# Patient Record
Sex: Female | Born: 1945 | Race: White | Hispanic: No | Marital: Married | Smoking: Never smoker
Health system: Southern US, Community
[De-identification: ages and names within clinical notes are randomized; demographics above are authoritative.]

## PROBLEM LIST (undated history)

## (undated) DIAGNOSIS — K219 Gastro-esophageal reflux disease without esophagitis: Secondary | ICD-10-CM

## (undated) DIAGNOSIS — G4733 Obstructive sleep apnea (adult) (pediatric): Secondary | ICD-10-CM

## (undated) DIAGNOSIS — E7800 Pure hypercholesterolemia, unspecified: Secondary | ICD-10-CM

---

## 1999-12-01 ENCOUNTER — Encounter: Admission: RE | Admit: 1999-12-01 | Discharge: 1999-12-01 | Payer: Self-pay | Admitting: Obstetrics and Gynecology

## 1999-12-01 ENCOUNTER — Encounter: Payer: Self-pay | Admitting: Obstetrics and Gynecology

## 2000-08-17 ENCOUNTER — Encounter: Payer: Self-pay | Admitting: Obstetrics and Gynecology

## 2000-08-17 ENCOUNTER — Encounter: Admission: RE | Admit: 2000-08-17 | Discharge: 2000-08-17 | Payer: Self-pay | Admitting: Obstetrics and Gynecology

## 2000-09-30 ENCOUNTER — Ambulatory Visit (HOSPITAL_COMMUNITY): Admission: RE | Admit: 2000-09-30 | Discharge: 2000-09-30 | Payer: Self-pay | Admitting: Gastroenterology

## 2000-09-30 ENCOUNTER — Encounter: Payer: Self-pay | Admitting: Gastroenterology

## 2000-10-06 ENCOUNTER — Ambulatory Visit (HOSPITAL_COMMUNITY): Admission: RE | Admit: 2000-10-06 | Discharge: 2000-10-06 | Payer: Self-pay | Admitting: Family Medicine

## 2000-10-06 ENCOUNTER — Encounter: Payer: Self-pay | Admitting: Family Medicine

## 2000-10-13 ENCOUNTER — Ambulatory Visit (HOSPITAL_COMMUNITY): Admission: RE | Admit: 2000-10-13 | Discharge: 2000-10-13 | Payer: Self-pay | Admitting: Gastroenterology

## 2000-10-13 ENCOUNTER — Encounter (INDEPENDENT_AMBULATORY_CARE_PROVIDER_SITE_OTHER): Payer: Self-pay | Admitting: Specialist

## 2001-08-23 ENCOUNTER — Encounter: Payer: Self-pay | Admitting: Obstetrics and Gynecology

## 2001-08-23 ENCOUNTER — Encounter: Admission: RE | Admit: 2001-08-23 | Discharge: 2001-08-23 | Payer: Self-pay | Admitting: Obstetrics and Gynecology

## 2002-08-24 ENCOUNTER — Encounter: Payer: Self-pay | Admitting: Obstetrics and Gynecology

## 2002-08-24 ENCOUNTER — Encounter: Admission: RE | Admit: 2002-08-24 | Discharge: 2002-08-24 | Payer: Self-pay | Admitting: Obstetrics and Gynecology

## 2003-03-21 ENCOUNTER — Other Ambulatory Visit: Admission: RE | Admit: 2003-03-21 | Discharge: 2003-03-21 | Payer: Self-pay | Admitting: Obstetrics and Gynecology

## 2003-06-05 ENCOUNTER — Inpatient Hospital Stay (HOSPITAL_COMMUNITY): Admission: EM | Admit: 2003-06-05 | Discharge: 2003-06-06 | Payer: Self-pay | Admitting: Emergency Medicine

## 2003-06-05 ENCOUNTER — Encounter: Payer: Self-pay | Admitting: Emergency Medicine

## 2003-08-29 ENCOUNTER — Encounter: Payer: Self-pay | Admitting: Obstetrics and Gynecology

## 2003-08-29 ENCOUNTER — Encounter: Admission: RE | Admit: 2003-08-29 | Discharge: 2003-08-29 | Payer: Self-pay | Admitting: Obstetrics and Gynecology

## 2004-04-15 ENCOUNTER — Other Ambulatory Visit: Admission: RE | Admit: 2004-04-15 | Discharge: 2004-04-15 | Payer: Self-pay | Admitting: Obstetrics and Gynecology

## 2004-04-22 ENCOUNTER — Ambulatory Visit (HOSPITAL_COMMUNITY): Admission: RE | Admit: 2004-04-22 | Discharge: 2004-04-22 | Payer: Self-pay | Admitting: Gastroenterology

## 2004-10-07 ENCOUNTER — Encounter: Admission: RE | Admit: 2004-10-07 | Discharge: 2004-10-07 | Payer: Self-pay | Admitting: Obstetrics and Gynecology

## 2005-05-04 ENCOUNTER — Other Ambulatory Visit: Admission: RE | Admit: 2005-05-04 | Discharge: 2005-05-04 | Payer: Self-pay | Admitting: Obstetrics and Gynecology

## 2005-10-12 ENCOUNTER — Encounter: Admission: RE | Admit: 2005-10-12 | Discharge: 2005-10-12 | Payer: Self-pay | Admitting: Family Medicine

## 2006-10-28 ENCOUNTER — Encounter: Admission: RE | Admit: 2006-10-28 | Discharge: 2006-10-28 | Payer: Self-pay | Admitting: Family Medicine

## 2007-10-31 ENCOUNTER — Encounter: Admission: RE | Admit: 2007-10-31 | Discharge: 2007-10-31 | Payer: Self-pay | Admitting: Obstetrics and Gynecology

## 2007-11-04 ENCOUNTER — Encounter: Admission: RE | Admit: 2007-11-04 | Discharge: 2007-11-04 | Payer: Self-pay | Admitting: Obstetrics and Gynecology

## 2008-11-13 ENCOUNTER — Encounter: Admission: RE | Admit: 2008-11-13 | Discharge: 2008-11-13 | Payer: Self-pay | Admitting: Obstetrics and Gynecology

## 2008-11-15 ENCOUNTER — Other Ambulatory Visit: Admission: RE | Admit: 2008-11-15 | Discharge: 2008-11-15 | Payer: Self-pay | Admitting: Family Medicine

## 2009-01-06 ENCOUNTER — Emergency Department (HOSPITAL_COMMUNITY): Admission: EM | Admit: 2009-01-06 | Discharge: 2009-01-06 | Payer: Self-pay | Admitting: Emergency Medicine

## 2010-11-25 ENCOUNTER — Encounter: Admission: RE | Admit: 2010-11-25 | Discharge: 2010-11-25 | Payer: Self-pay | Admitting: Obstetrics and Gynecology

## 2011-01-18 ENCOUNTER — Encounter: Payer: Self-pay | Admitting: Obstetrics and Gynecology

## 2011-04-13 LAB — COMPREHENSIVE METABOLIC PANEL
AST: 25 U/L (ref 0–37)
Albumin: 3.1 g/dL — ABNORMAL LOW (ref 3.5–5.2)
Alkaline Phosphatase: 86 U/L (ref 39–117)
BUN: 9 mg/dL (ref 6–23)
GFR calc Af Amer: >60 mL/min (ref >60)
Potassium: 3.8 mEq/L (ref 3.5–5.1)
Total Protein: 7 g/dL (ref 6.0–8.3)

## 2011-04-13 LAB — DIFFERENTIAL
Basophils Relative: 0 % (ref 0–1)
Lymphocytes Relative: 25 % (ref 12–46)
Monocytes Absolute: 0.7 10*3/uL (ref 0.1–1.0)
Monocytes Relative: 9 % (ref 3–12)
Neutro Abs: 4.8 10*3/uL (ref 1.7–7.7)

## 2011-04-13 LAB — CBC
HCT: 42.8 % (ref 36.0–46.0)
Platelets: 295 10*3/uL (ref 150–400)
RDW: 12.2 % (ref 11.5–15.5)

## 2011-04-13 LAB — URINALYSIS, ROUTINE W REFLEX MICROSCOPIC
Glucose, UA: NEGATIVE mg/dL
Ketones, ur: NEGATIVE mg/dL
Nitrite: NEGATIVE
Protein, ur: NEGATIVE mg/dL
Urobilinogen, UA: 0.2 mg/dL (ref 0.0–1.0)

## 2011-05-15 NOTE — Discharge Summary (Signed)
[name], [name]                           ACCOUNT NO.:  [id number]   MEDICAL RECORD NO.:  [id number]                   PATIENT TYPE:  INP   LOCATION:  2003                                 FACILITY:  [facility]   PHYSICIAN:  Francisca December, M.D.               DATE OF BIRTH:  1946/07/17   DATE OF ADMISSION:  06/05/2003  DATE OF DISCHARGE:  06/06/2003                                 DISCHARGE SUMMARY   ADMISSION DIAGNOSES:  1. Atypical chest pain; rule out myocardial infarction.  2. Hiatal hernia.  3. Unknown lipid status.  4. Elevated blood sugar; questionable glucose intolerance.  5. Postmenopausal.   DISCHARGE DIAGNOSES:  1. Atypical chest pain, resolved, myocardial infarction ruled out with     negative enzymes.  Scheduled for outpatient stress test.  2. Lipid profile is pending.  3. Fasting blood sugar this morning was 89.  4. Gout.  5. Glucose intolerance.   HISTORY OF PRESENT ILLNESS:  The patient is a 65 year old married white  female without prior heart disease but does comment on a history of  rheumatic heart disease as a child that she was told she would grow out of  it.  Today, at a class on how to identify myocardial infarction at church,  she developed pressure in chest, lightheadedness and nausea.  She was given  an aspirin and felt clammy.  She put her head down and then felt better  within about ten minutes.  EMS was called and she was brought to [facility].  She was not given any nitroglycerin.  She states she has never  felt this way before and her symptoms are gone now.   In the emergency room, EKG shows normal sinus rhythm without acute  abnormality.  Initial cardiac enzymes are negative.  She will be transferred  for three hour observation to rule out MI.  Will check cardiac enzymes and  this patient is low for ischemia; therefore, heparin and nitroglycerin will  not be started.  Will continue an aspirin a day.  Will check fasting BMET  and  lipid profile in the morning.  If her tests come back negative, she will  be discharged to home.   PROCEDURE:  None.   CONSULTATIONS:  None.   COMPLICATIONS:  None.   HOSPITAL COURSE:  The patient was admitted to [facility] on June 05, 2003, with atypical chest pain.  EKG was negative for ischemia.  Cardiac  enzymes were negative x3 as follows:  CK 180, 123, 94; MB 4.3, 2.6, 1.7;  Troponin-I less than 0.01, 0.01, less than 0.01.   Glucose, upon admission, was elevated at 117.  A fasting BMET, drawn the  morning of June 06, 2003, showed glucose 89, BUN and creatinine were 8 and  0.8, respectively.  Chest x-ray showed no active disease.   The patient was felt to be stable for  discharge to home on June 06, 2003.  She had no recurrent chest discomfort.   DISCHARGE MEDICATIONS:  1. Enteric-coated aspirin 325 mg.  2. Protonix 40 mg daily.   ACTIVITY:  As tolerated.  No strenuous activity until results of tests are  obtained.   DIET:  Low fat, low salt.   FOLLOWUP:  Cardiac stress test is scheduled for Thursday, June 14, 2003, at  8:30 in the morning.  She should anticipate being at the office for at least  four hours.  Nothing to eat or drink after midnight the night before the  study.  No caffeine the day of or before the test.     [name], P.A.-C                 Francisca December, M.D.    TCJ/MEDQ  D:  06/06/2003  T:  06/06/2003  Job:  [id number]   cc:   [name], M.D.  [address]  [place]  [place] [address]  Fax: [phone]

## 2011-05-15 NOTE — H&P (Signed)
[name], [name]                           ACCOUNT NO.:  [id number]   MEDICAL RECORD NO.:  [id number]                   PATIENT TYPE:  INP   LOCATION:  1823                                 FACILITY:  [facility]   PHYSICIAN:  [name], M.D.               DATE OF BIRTH:  [birth year]   DATE OF ADMISSION:  06/05/2003  DATE OF DISCHARGE:                                HISTORY & PHYSICAL   ADMISSION DIAGNOSIS:  Atypical chest pain.   CHIEF COMPLAINT:  10-minute chest discomfort.   HISTORY OF PRESENT ILLNESS:  This 65 year old white female without prior  heart disease, but does make a comment about history of rheumatic heart  disease as a child.  Today at a class on how to identify a myocardial  infarction at church, she suddenly developed pressure in the chest, light  headedness, and nausea.  She was given an aspirin and felt clammy. She put  her head down and then felt better within about 10 minutes.  EMS was called  and she was brought to [facility]. She was not given any  nitroglycerin. She states that she has never felt this way before and her  symptoms are gone now.   ALLERGIES:  CODEINE causes hives.   MEDICATIONS:  None.   PAST MEDICAL HISTORY:  1. Rheumatic heart disease as a child - valve did not open right. Told she     would grow out of it.  2. Denies hyperlipidemia or diabetes.  3. Postmenopausal - took estrogen for three years. Has been off for about     six months.   SOCIAL HISTORY:  The patient is married. Mother of two.  Denies tobacco and  alcohol.  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 She has been walking daily for  two weeks without symptoms.   FAMILY HISTORY:  No first degree relatives with heart disease. She did have  a maternal uncle who died of an MI at age 64.   REVIEW OF SYSTEMS:  No constitutional symptoms, fever, chills, heartburn,  reflux, headache, or visual changes. No dysphagia.  Hearing okay.  No  thyroid disease, cough, or  hemoptysis. She does have a hiatal hernia. This  was diagnosed by an upper GI.  No melena or dark, tarry stools.  No  musculoskeletal complaints. No CVA or neurologic problems.  No memory  difficulty.   PHYSICAL EXAMINATION:  VITAL SIGNS: Blood pressure 114/73, pulse 87,  respirations 23.  GENERAL: The patient is alert and oriented x3 and in no acute distress.  HEENT:  Normocephalic and atraumatic.  PERRL. EOMI.  Nares patent.  Oropharynx benign.  NECK:  Supple without bruits or masses.  LUNGS:  Clear to auscultation. No wheeze or crackles.  HEART:  Regular rate and rhythm without murmurs, rubs, or gallops.  ABDOMEN:  Soft, nontender, and nondistended.  Normal active bowel sounds x4  quadrants.  No hepatosplenomegaly.  EXTREMITIES:  2+ femoral pulses bilaterally.  No bruits.  2+ distal pulses  bilaterally without edema.  NEUROLOGY:  Nonfocal.  Mentation intact.   EKG shows normal sinus rhythm with no acute abnormality.  Chest x-ray  results are pending.   LABORATORY DATA:  WBC 8.5, hemoglobin 12.6, platelets 253.  Sodium 136,  potassium 4.1, BUN 11, creatinine 1.0, and glucose slightly elevated at 117.  CK 180, MB 4.3, and troponin I less than 0.01.  The pH 7.41, pCO2 43.0, hCO3  28.0. UA negative.   IMPRESSION:  1. Atypical chest pain, rule out myocardial infarction.  2. Elevated blood sugar - question glucose intolerance.  3. Unknown lipid status.  4. Postmenopausal.  5. Rheumatic heart disease as a child.  6. Hiatal hernia.   PLAN:  Admit.  Check all cardiac enzymes.  Check fasting BMET in the  morning. Daily aspirin. EKG in the morning.  D5 1/2 normal saline at K[facility].   If all of her tests come back negative, she will go home in the morning and  we will plan for an outpatient stress test.     [name], P.A.                   [name], M.D.    TCJ/MEDQ  D:  06/05/2003  T:  06/05/2003  Job:  [id number]   cc:   [name], M.D.  [address]  [place]  [place] [address]  Fax: [phone]

## 2011-05-15 NOTE — Op Note (Signed)
[name], [name]                           ACCOUNT NO.:  [id number]   MEDICAL RECORD NO.:  [id number]                   PATIENT TYPE:  AMB   LOCATION:  ENDO                                 FACILITY[facility]  [facility]   PHYSICIAN:  Petra Kuba, M.D.                 DATE OF BIRTH:  May 23, 1946   DATE OF PROCEDURE:  04/22/2004  DATE OF DISCHARGE:                                 OPERATIVE REPORT   PROCEDURE:  Colonoscopy.   INDICATIONS FOR PROCEDURE:  History of colon polyps due for repeat  screening.   Consent was signed after risks, benefits, methods, and options were  thoroughly discussed in the office in the past.   MEDICINES USED:  Demerol 50, Versed 5.   DESCRIPTION OF PROCEDURE:  Rectal inspection was pertinent for small  external hemorrhoids. Digital exam was negative. The pediatric video  adjustable colonoscope was inserted and with abdominal pressure  easily able  to be advanced around the colon to the cecum. No abnormality was seen on  insertion.  The cecum was identified by the appendiceal orifice and the  ileocecal valve, the scope was slowly withdrawn. The  prep was adequate.  There was some liquid stool that required washing and suctioning.  On slow  withdrawal through the colon, the cecum, ascending, transverse, and majority  of the descending was normal.  There was a rare very early left sided  diverticula but no polyps were seen.  Once back in the rectum, anal rectal  pullthrough and retroflexion confirmed some small hemorrhoids.  The scope  was reinserted a short ways up the left side of the colon, air was  suctioned, scope removed.  The patient tolerated the procedure well. There  was no obvious or immediate complications.   ENDOSCOPIC DIAGNOSIS:  1. Internal and external small hemorrhoids.  2. Rare very early left sided diverticula.  3. Otherwise within normal limits to the cecum.   PLAN:  Happy to see back p.r.n. otherwise return care to Dr. Nicholos Johns for the  customary health care maintenance to include yearly rectals and guaiacs and  recheck colon screening in five years.                                               Petra Kuba, M.D.    MEM/MEDQ  D:  04/22/2004  T:  04/22/2004  Job:  [id number]   cc:   Molly Maduro A. Nicholos Johns, M.D.  [address]  [place]  [place] [address]  Fax: [phone]

## 2011-05-15 NOTE — Procedures (Signed)
[facility]  Patient:    [name], [name]                      MRN: [id number] Proc. Date: 10/13/00 Adm. Date:  [id number] Attending:  Nelda Marseille CC:         Elana Alm. Eliezer Lofts., M.D.   Procedure Report  PROCEDURE:  Colonoscopy with polypectomy.  INDICATIONS FOR PROCEDURE:  Vague GI symptoms seems to be changing periodically. Due for colonic screening.  Consent was signed after risks, benefits, methods, and options were thoroughly discussed in the office and before this procedure.  MEDICINES USED:  Demerol 70, Versed 7.  DESCRIPTION OF PROCEDURE:  Rectal inspection was pertinent for small external hemorrhoids. Digital exam was negative. The video pediatric colonoscope was inserted and fairly easily advanced around the colon to the cecum despite a very tortuous colon. This did require rolling on her back and some abdominal pressures. On insertion in the transverse, 2 sessile small polyps were seen and were each cold biopsied x 1 on insertion. It was identified by the appendiceal orifice and the ileocecal valve. No other obvious abnormalities were seen on insertion. The scope was inserted a short ways into the terminal ileum which was normal. Photo documentation was obtained. The scope was slowly withdrawn. The prep was adequate. There was some liquid stool that required washing and suctioning. In the cecum, a 2-3 mm polyp was seen and was cold biopsied x 3. The scope was further withdrawn. Two ascending and the same 2 transverse polyps seen on insertion, all sessile were seen and were hot biopsied x 1-3 depending on their size. Two of them were linear along the fold and slightly sessile approximately 5-6 mm long. All of these were put in the same container. The scope was further withdrawn. The descending and majority of the sigmoid was normal. In the distal sigmoid, a 5 mm polyp was seen, snared, electrocautery applied and the polyp was  suctioned through the scope and collected in the trap. There was a nice white coagulum without any residual polyp seen. The scope was further withdrawn and in the proximal rectum, a tiny 2 mm polyp was seen and was hot biopsied x 1 and put in the same path container with the sigmoid polyp. Once back in the rectum, the scope was retroflexed pertinent for some internal hemorrhoids. The scope was straightened, air was suctioned, scope removed. The patient tolerated the procedure well and there was on obvious or immediate complication.  ENDOSCOPIC DIAGNOSIS: 1. Internal/external hemorrhoids, small. 2. Multiple small polyps with hot biopsies in the rectum, transverse and the    ascending. A snare in the sigmoid and cold biopsy of the cecum. 3. Tortuous colon. 4. Otherwise within normal limits to the terminal ileum.  PLAN:  Await pathology to determine future colonic screening. 10 day post polypectomy instructions. Will give her some hiatal hernia reading materials and continue Prevacid and follow-up in 6-8 weeks but have her call me sooner p.r.n. DD:  10/13/00 TD:  10/14/00 Job: [id number]

## 2011-10-19 ENCOUNTER — Other Ambulatory Visit: Payer: Self-pay | Admitting: Obstetrics and Gynecology

## 2011-10-19 DIAGNOSIS — Z1231 Encounter for screening mammogram for malignant neoplasm of breast: Secondary | ICD-10-CM

## 2011-12-01 ENCOUNTER — Ambulatory Visit
Admission: RE | Admit: 2011-12-01 | Discharge: 2011-12-01 | Disposition: A | Discharge status: Home / Self Care | Payer: Medicare Other | Source: Ambulatory Visit | Attending: Obstetrics and Gynecology | Admitting: Obstetrics and Gynecology

## 2011-12-01 DIAGNOSIS — Z1231 Encounter for screening mammogram for malignant neoplasm of breast: Secondary | ICD-10-CM

## 2011-12-30 DIAGNOSIS — M5137 Other intervertebral disc degeneration, lumbosacral region: Secondary | ICD-10-CM | POA: Diagnosis not present

## 2011-12-30 DIAGNOSIS — IMO0001 Reserved for inherently not codable concepts without codable children: Secondary | ICD-10-CM | POA: Diagnosis not present

## 2011-12-30 DIAGNOSIS — M545 Low back pain, unspecified: Secondary | ICD-10-CM | POA: Diagnosis not present

## 2011-12-30 DIAGNOSIS — M999 Biomechanical lesion, unspecified: Secondary | ICD-10-CM | POA: Diagnosis not present

## 2012-01-01 DIAGNOSIS — IMO0001 Reserved for inherently not codable concepts without codable children: Secondary | ICD-10-CM | POA: Diagnosis not present

## 2012-01-01 DIAGNOSIS — M545 Low back pain, unspecified: Secondary | ICD-10-CM | POA: Diagnosis not present

## 2012-01-01 DIAGNOSIS — M5137 Other intervertebral disc degeneration, lumbosacral region: Secondary | ICD-10-CM | POA: Diagnosis not present

## 2012-01-01 DIAGNOSIS — M999 Biomechanical lesion, unspecified: Secondary | ICD-10-CM | POA: Diagnosis not present

## 2012-01-04 DIAGNOSIS — M999 Biomechanical lesion, unspecified: Secondary | ICD-10-CM | POA: Diagnosis not present

## 2012-01-04 DIAGNOSIS — IMO0001 Reserved for inherently not codable concepts without codable children: Secondary | ICD-10-CM | POA: Diagnosis not present

## 2012-01-04 DIAGNOSIS — M545 Low back pain, unspecified: Secondary | ICD-10-CM | POA: Diagnosis not present

## 2012-01-04 DIAGNOSIS — M5137 Other intervertebral disc degeneration, lumbosacral region: Secondary | ICD-10-CM | POA: Diagnosis not present

## 2012-01-11 DIAGNOSIS — M545 Low back pain, unspecified: Secondary | ICD-10-CM | POA: Diagnosis not present

## 2012-01-11 DIAGNOSIS — M999 Biomechanical lesion, unspecified: Secondary | ICD-10-CM | POA: Diagnosis not present

## 2012-01-11 DIAGNOSIS — M5137 Other intervertebral disc degeneration, lumbosacral region: Secondary | ICD-10-CM | POA: Diagnosis not present

## 2012-01-11 DIAGNOSIS — IMO0001 Reserved for inherently not codable concepts without codable children: Secondary | ICD-10-CM | POA: Diagnosis not present

## 2012-01-14 DIAGNOSIS — M545 Low back pain, unspecified: Secondary | ICD-10-CM | POA: Diagnosis not present

## 2012-01-14 DIAGNOSIS — M5137 Other intervertebral disc degeneration, lumbosacral region: Secondary | ICD-10-CM | POA: Diagnosis not present

## 2012-01-14 DIAGNOSIS — M999 Biomechanical lesion, unspecified: Secondary | ICD-10-CM | POA: Diagnosis not present

## 2012-01-14 DIAGNOSIS — IMO0001 Reserved for inherently not codable concepts without codable children: Secondary | ICD-10-CM | POA: Diagnosis not present

## 2012-01-20 DIAGNOSIS — M999 Biomechanical lesion, unspecified: Secondary | ICD-10-CM | POA: Diagnosis not present

## 2012-01-20 DIAGNOSIS — M545 Low back pain, unspecified: Secondary | ICD-10-CM | POA: Diagnosis not present

## 2012-01-20 DIAGNOSIS — IMO0001 Reserved for inherently not codable concepts without codable children: Secondary | ICD-10-CM | POA: Diagnosis not present

## 2012-01-20 DIAGNOSIS — M5137 Other intervertebral disc degeneration, lumbosacral region: Secondary | ICD-10-CM | POA: Diagnosis not present

## 2012-01-25 DIAGNOSIS — M5137 Other intervertebral disc degeneration, lumbosacral region: Secondary | ICD-10-CM | POA: Diagnosis not present

## 2012-01-25 DIAGNOSIS — M545 Low back pain, unspecified: Secondary | ICD-10-CM | POA: Diagnosis not present

## 2012-01-25 DIAGNOSIS — M999 Biomechanical lesion, unspecified: Secondary | ICD-10-CM | POA: Diagnosis not present

## 2012-01-25 DIAGNOSIS — IMO0001 Reserved for inherently not codable concepts without codable children: Secondary | ICD-10-CM | POA: Diagnosis not present

## 2012-05-10 DIAGNOSIS — R232 Flushing: Secondary | ICD-10-CM | POA: Diagnosis not present

## 2012-05-10 DIAGNOSIS — IMO0002 Reserved for concepts with insufficient information to code with codable children: Secondary | ICD-10-CM | POA: Diagnosis not present

## 2012-05-10 DIAGNOSIS — E785 Hyperlipidemia, unspecified: Secondary | ICD-10-CM | POA: Diagnosis not present

## 2012-05-10 DIAGNOSIS — G479 Sleep disorder, unspecified: Secondary | ICD-10-CM | POA: Diagnosis not present

## 2012-05-10 DIAGNOSIS — M1710 Unilateral primary osteoarthritis, unspecified knee: Secondary | ICD-10-CM | POA: Diagnosis not present

## 2012-10-05 DIAGNOSIS — Z23 Encounter for immunization: Secondary | ICD-10-CM | POA: Diagnosis not present

## 2012-10-21 ENCOUNTER — Other Ambulatory Visit: Payer: Self-pay | Admitting: Obstetrics and Gynecology

## 2012-10-21 DIAGNOSIS — Z1231 Encounter for screening mammogram for malignant neoplasm of breast: Secondary | ICD-10-CM

## 2012-10-31 DIAGNOSIS — H43399 Other vitreous opacities, unspecified eye: Secondary | ICD-10-CM | POA: Diagnosis not present

## 2012-10-31 DIAGNOSIS — H2510 Age-related nuclear cataract, unspecified eye: Secondary | ICD-10-CM | POA: Diagnosis not present

## 2012-12-01 ENCOUNTER — Ambulatory Visit
Admission: RE | Admit: 2012-12-01 | Discharge: 2012-12-01 | Disposition: A | Discharge status: Home / Self Care | Payer: Medicare Other | Source: Ambulatory Visit | Attending: Obstetrics and Gynecology | Admitting: Obstetrics and Gynecology

## 2012-12-01 DIAGNOSIS — Z1231 Encounter for screening mammogram for malignant neoplasm of breast: Secondary | ICD-10-CM | POA: Diagnosis not present

## 2013-01-11 DIAGNOSIS — Z0000 Encounter for general adult medical examination without abnormal findings: Secondary | ICD-10-CM | POA: Diagnosis not present

## 2013-01-11 DIAGNOSIS — E785 Hyperlipidemia, unspecified: Secondary | ICD-10-CM | POA: Diagnosis not present

## 2013-01-11 DIAGNOSIS — R232 Flushing: Secondary | ICD-10-CM | POA: Diagnosis not present

## 2013-01-11 DIAGNOSIS — IMO0002 Reserved for concepts with insufficient information to code with codable children: Secondary | ICD-10-CM | POA: Diagnosis not present

## 2013-01-11 DIAGNOSIS — M26609 Unspecified temporomandibular joint disorder, unspecified side: Secondary | ICD-10-CM | POA: Diagnosis not present

## 2013-02-23 DIAGNOSIS — Z124 Encounter for screening for malignant neoplasm of cervix: Secondary | ICD-10-CM | POA: Diagnosis not present

## 2013-05-16 DIAGNOSIS — K219 Gastro-esophageal reflux disease without esophagitis: Secondary | ICD-10-CM | POA: Diagnosis not present

## 2013-05-16 DIAGNOSIS — J069 Acute upper respiratory infection, unspecified: Secondary | ICD-10-CM | POA: Diagnosis not present

## 2013-06-15 DIAGNOSIS — H43399 Other vitreous opacities, unspecified eye: Secondary | ICD-10-CM | POA: Diagnosis not present

## 2013-06-15 DIAGNOSIS — H2510 Age-related nuclear cataract, unspecified eye: Secondary | ICD-10-CM | POA: Diagnosis not present

## 2013-06-20 DIAGNOSIS — H52229 Regular astigmatism, unspecified eye: Secondary | ICD-10-CM | POA: Diagnosis not present

## 2013-06-20 DIAGNOSIS — H2510 Age-related nuclear cataract, unspecified eye: Secondary | ICD-10-CM | POA: Diagnosis not present

## 2013-06-20 DIAGNOSIS — H269 Unspecified cataract: Secondary | ICD-10-CM | POA: Diagnosis not present

## 2013-06-28 DIAGNOSIS — H2510 Age-related nuclear cataract, unspecified eye: Secondary | ICD-10-CM | POA: Diagnosis not present

## 2013-07-20 DIAGNOSIS — H269 Unspecified cataract: Secondary | ICD-10-CM | POA: Diagnosis not present

## 2013-07-20 DIAGNOSIS — H2510 Age-related nuclear cataract, unspecified eye: Secondary | ICD-10-CM | POA: Diagnosis not present

## 2013-09-18 DIAGNOSIS — J40 Bronchitis, not specified as acute or chronic: Secondary | ICD-10-CM | POA: Diagnosis not present

## 2013-12-18 DIAGNOSIS — Z23 Encounter for immunization: Secondary | ICD-10-CM | POA: Diagnosis not present

## 2014-02-05 ENCOUNTER — Other Ambulatory Visit: Payer: Self-pay

## 2014-02-05 DIAGNOSIS — Z1231 Encounter for screening mammogram for malignant neoplasm of breast: Secondary | ICD-10-CM

## 2014-02-23 ENCOUNTER — Ambulatory Visit
Admission: RE | Admit: 2014-02-23 | Discharge: 2014-02-23 | Disposition: A | Discharge status: Home / Self Care | Payer: Medicare Other | Source: Ambulatory Visit

## 2014-02-23 DIAGNOSIS — Z1231 Encounter for screening mammogram for malignant neoplasm of breast: Secondary | ICD-10-CM | POA: Diagnosis not present

## 2014-04-04 DIAGNOSIS — M1990 Unspecified osteoarthritis, unspecified site: Secondary | ICD-10-CM | POA: Diagnosis not present

## 2014-04-04 DIAGNOSIS — J40 Bronchitis, not specified as acute or chronic: Secondary | ICD-10-CM | POA: Diagnosis not present

## 2014-04-04 DIAGNOSIS — N951 Menopausal and female climacteric states: Secondary | ICD-10-CM | POA: Diagnosis not present

## 2014-04-04 DIAGNOSIS — G479 Sleep disorder, unspecified: Secondary | ICD-10-CM | POA: Diagnosis not present

## 2014-04-04 DIAGNOSIS — E785 Hyperlipidemia, unspecified: Secondary | ICD-10-CM | POA: Diagnosis not present

## 2014-08-23 DIAGNOSIS — N8189 Other female genital prolapse: Secondary | ICD-10-CM | POA: Diagnosis not present

## 2014-08-23 DIAGNOSIS — N952 Postmenopausal atrophic vaginitis: Secondary | ICD-10-CM | POA: Diagnosis not present

## 2014-08-23 DIAGNOSIS — Z01419 Encounter for gynecological examination (general) (routine) without abnormal findings: Secondary | ICD-10-CM | POA: Diagnosis not present

## 2014-11-26 DIAGNOSIS — N8189 Other female genital prolapse: Secondary | ICD-10-CM | POA: Diagnosis not present

## 2014-11-27 DIAGNOSIS — Z23 Encounter for immunization: Secondary | ICD-10-CM | POA: Diagnosis not present

## 2014-12-03 DIAGNOSIS — E785 Hyperlipidemia, unspecified: Secondary | ICD-10-CM | POA: Diagnosis not present

## 2014-12-03 DIAGNOSIS — J029 Acute pharyngitis, unspecified: Secondary | ICD-10-CM | POA: Diagnosis not present

## 2014-12-03 DIAGNOSIS — J329 Chronic sinusitis, unspecified: Secondary | ICD-10-CM | POA: Diagnosis not present

## 2015-01-10 DIAGNOSIS — D126 Benign neoplasm of colon, unspecified: Secondary | ICD-10-CM | POA: Diagnosis not present

## 2015-01-10 DIAGNOSIS — D123 Benign neoplasm of transverse colon: Secondary | ICD-10-CM | POA: Diagnosis not present

## 2015-01-10 DIAGNOSIS — K635 Polyp of colon: Secondary | ICD-10-CM | POA: Diagnosis not present

## 2015-01-10 DIAGNOSIS — D124 Benign neoplasm of descending colon: Secondary | ICD-10-CM | POA: Diagnosis not present

## 2015-01-10 DIAGNOSIS — K5730 Diverticulosis of large intestine without perforation or abscess without bleeding: Secondary | ICD-10-CM | POA: Diagnosis not present

## 2015-01-10 DIAGNOSIS — Z86010 Personal history of colonic polyps: Secondary | ICD-10-CM | POA: Diagnosis not present

## 2015-01-10 DIAGNOSIS — Z09 Encounter for follow-up examination after completed treatment for conditions other than malignant neoplasm: Secondary | ICD-10-CM | POA: Diagnosis not present

## 2015-02-04 DIAGNOSIS — J209 Acute bronchitis, unspecified: Secondary | ICD-10-CM | POA: Diagnosis not present

## 2015-02-17 IMAGING — MG MM SCREENING BREAST TOMO BILATERAL
9 of 18 series · 9 of 40 positions shown · non-contrast
Comparison: Previous exam(s).

CLINICAL DATA: Screening.

EXAM:
DIGITAL SCREENING BILATERAL MAMMOGRAM WITH 3D TOMO WITH CAD

[L MLO]
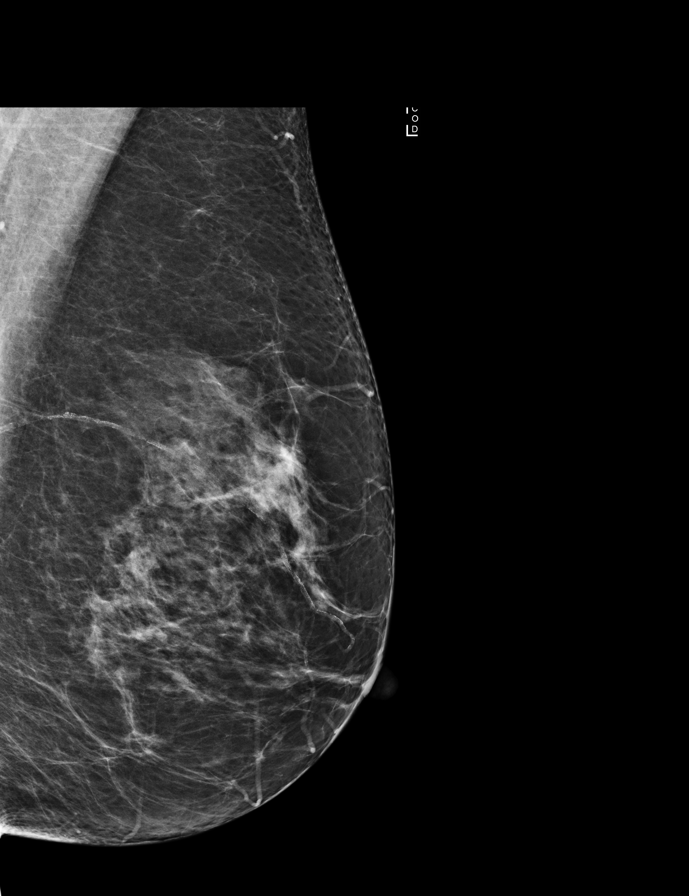

[R CC (1 of 2)]
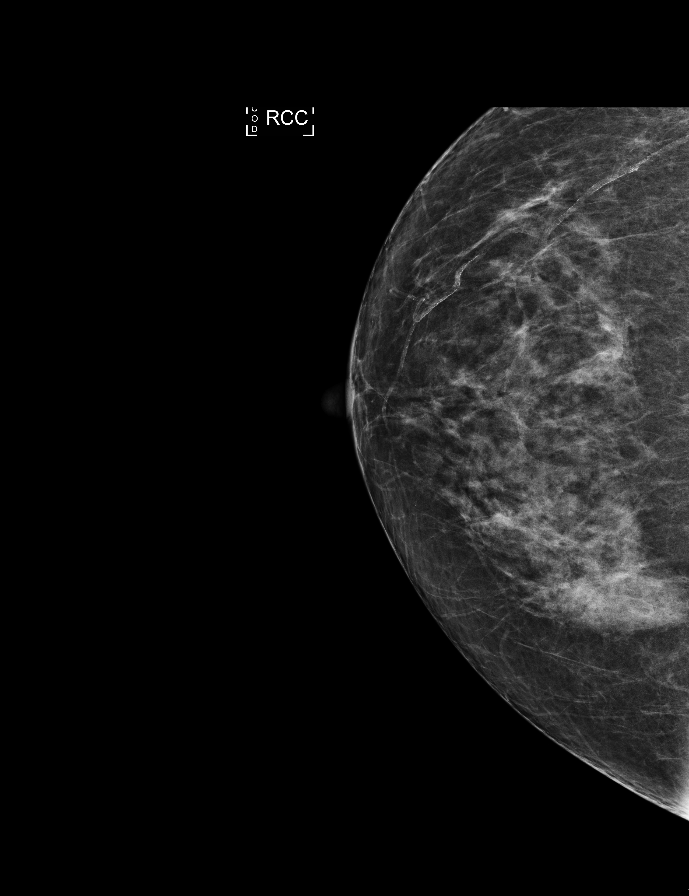

[R MLO]
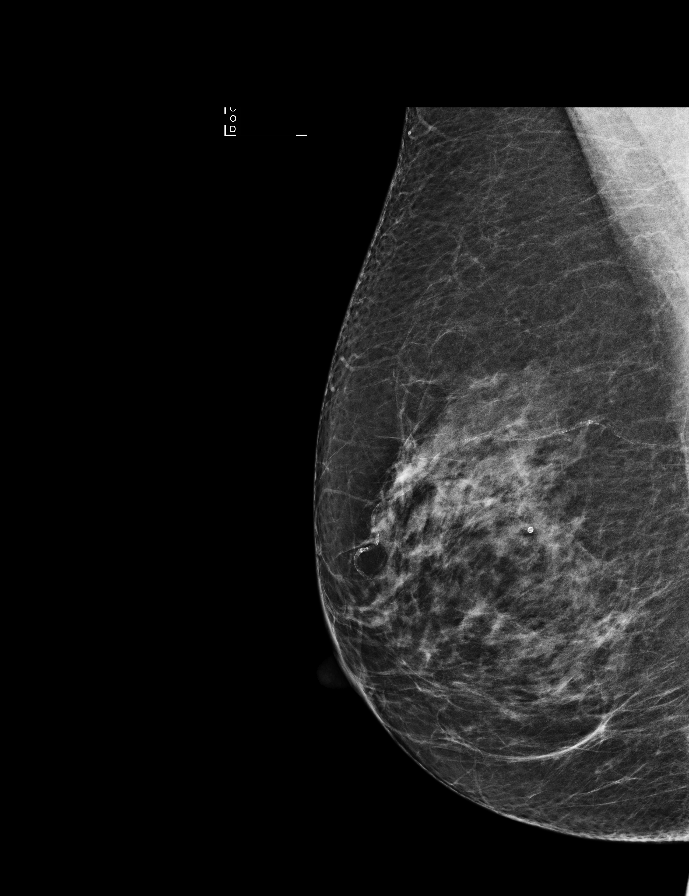

[L CC (1 of 2)]
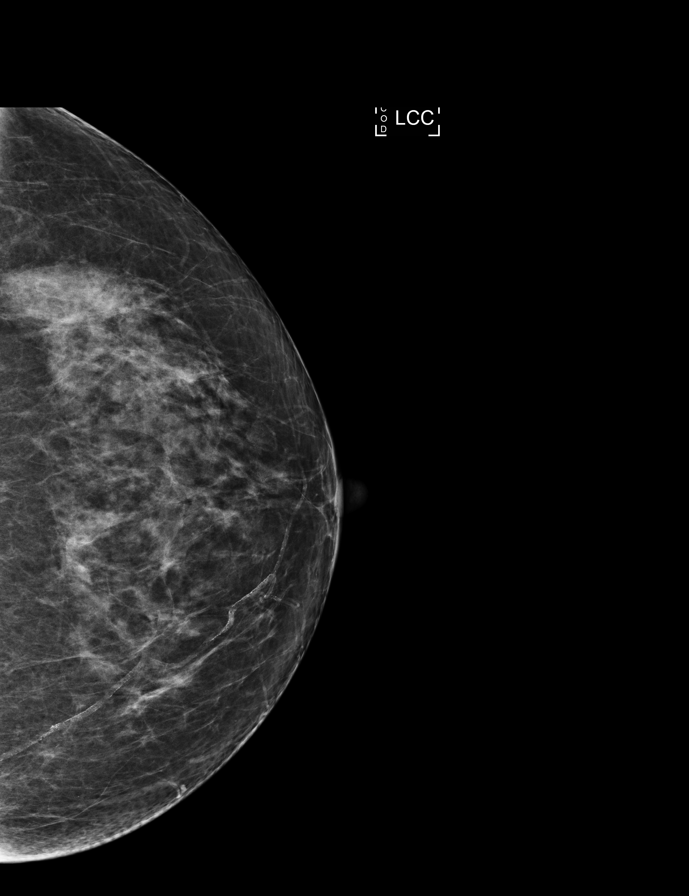

[R CC (2 of 2)]
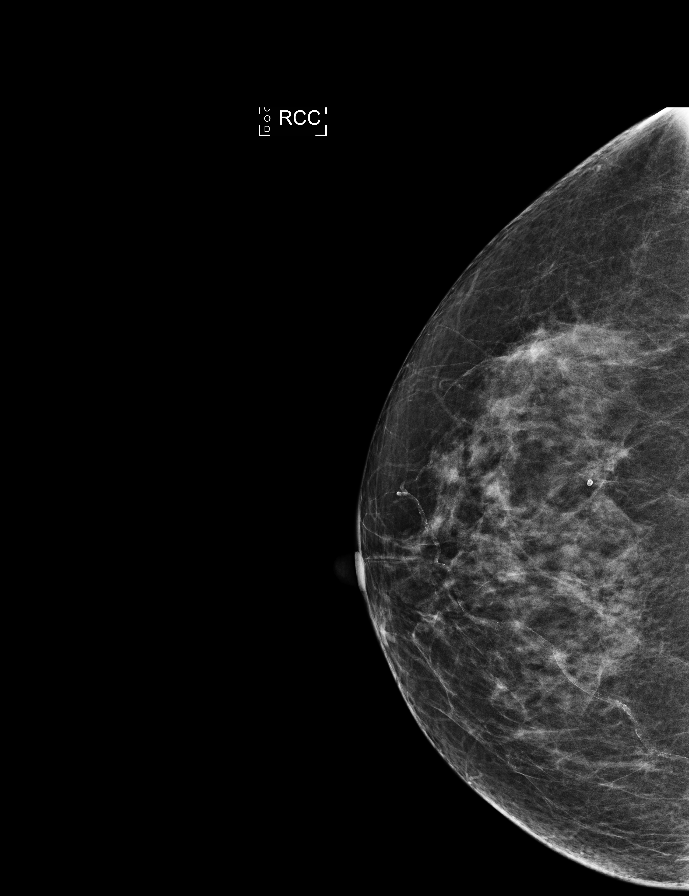

[L CC (2 of 2)]
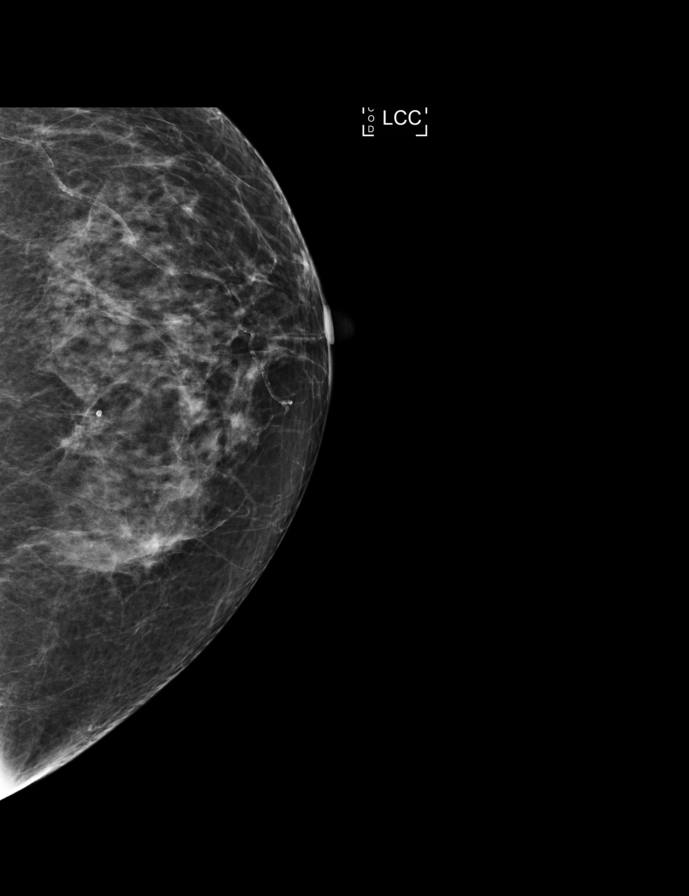

[L MLO synth-2D]
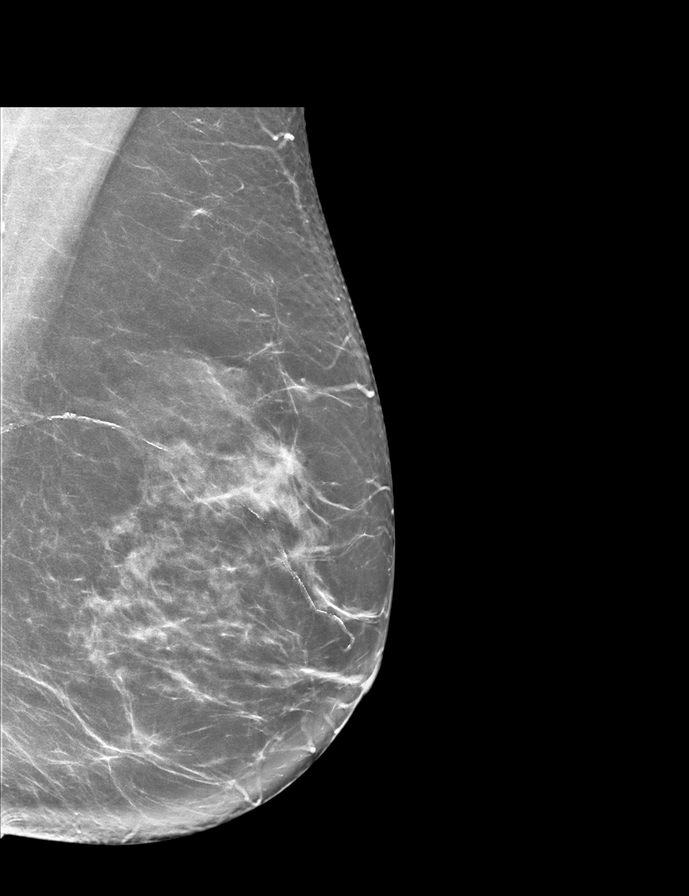

[L CC synth-2D]
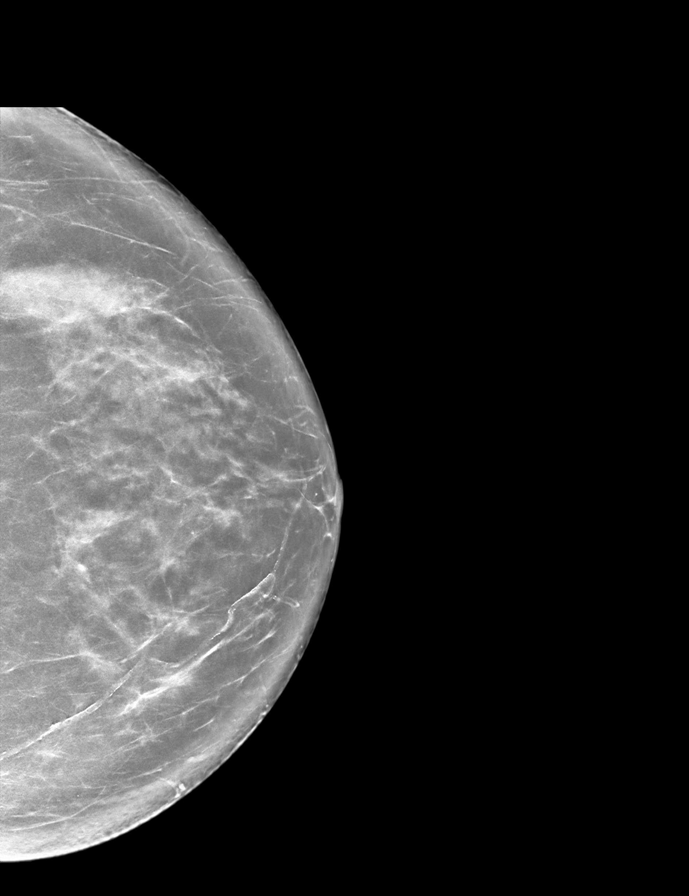

[R CC synth-2D]
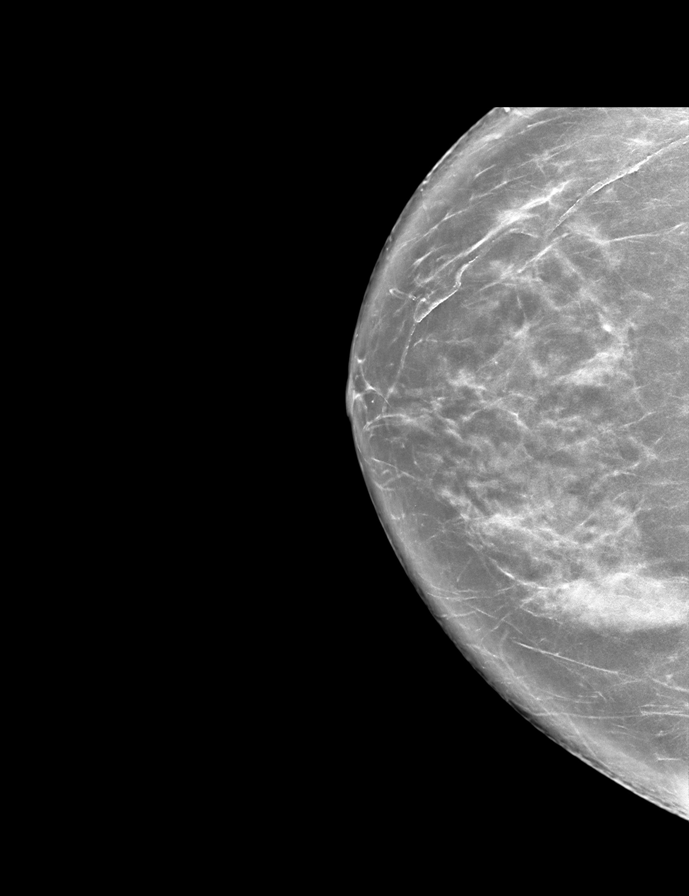

[9 of 40 positions shown; findings below may reference images not displayed]

ACR Breast Density Category c: The breast tissue is heterogeneously
dense, which may obscure small masses.
FINDINGS: There are no findings suspicious for malignancy. Images were
processed with CAD.
IMPRESSION: No mammographic evidence of malignancy. A result letter of this
screening mammogram will be mailed directly to the patient.

RECOMMENDATION:
Screening mammogram in one year. (Code:OA-G-1SS)

BI-RADS CATEGORY  1: Negative.

## 2015-07-03 DIAGNOSIS — M545 Low back pain: Secondary | ICD-10-CM | POA: Diagnosis not present

## 2015-07-03 DIAGNOSIS — M9902 Segmental and somatic dysfunction of thoracic region: Secondary | ICD-10-CM | POA: Diagnosis not present

## 2015-07-03 DIAGNOSIS — M9903 Segmental and somatic dysfunction of lumbar region: Secondary | ICD-10-CM | POA: Diagnosis not present

## 2015-07-03 DIAGNOSIS — M9901 Segmental and somatic dysfunction of cervical region: Secondary | ICD-10-CM | POA: Diagnosis not present

## 2015-07-05 DIAGNOSIS — M9901 Segmental and somatic dysfunction of cervical region: Secondary | ICD-10-CM | POA: Diagnosis not present

## 2015-07-05 DIAGNOSIS — M9902 Segmental and somatic dysfunction of thoracic region: Secondary | ICD-10-CM | POA: Diagnosis not present

## 2015-07-05 DIAGNOSIS — M545 Low back pain: Secondary | ICD-10-CM | POA: Diagnosis not present

## 2015-07-05 DIAGNOSIS — M9903 Segmental and somatic dysfunction of lumbar region: Secondary | ICD-10-CM | POA: Diagnosis not present

## 2015-07-08 DIAGNOSIS — M9903 Segmental and somatic dysfunction of lumbar region: Secondary | ICD-10-CM | POA: Diagnosis not present

## 2015-07-08 DIAGNOSIS — M9902 Segmental and somatic dysfunction of thoracic region: Secondary | ICD-10-CM | POA: Diagnosis not present

## 2015-07-08 DIAGNOSIS — M9901 Segmental and somatic dysfunction of cervical region: Secondary | ICD-10-CM | POA: Diagnosis not present

## 2015-07-08 DIAGNOSIS — M545 Low back pain: Secondary | ICD-10-CM | POA: Diagnosis not present

## 2015-07-10 DIAGNOSIS — M9902 Segmental and somatic dysfunction of thoracic region: Secondary | ICD-10-CM | POA: Diagnosis not present

## 2015-07-10 DIAGNOSIS — M545 Low back pain: Secondary | ICD-10-CM | POA: Diagnosis not present

## 2015-07-10 DIAGNOSIS — M9901 Segmental and somatic dysfunction of cervical region: Secondary | ICD-10-CM | POA: Diagnosis not present

## 2015-07-10 DIAGNOSIS — M9903 Segmental and somatic dysfunction of lumbar region: Secondary | ICD-10-CM | POA: Diagnosis not present

## 2015-07-15 DIAGNOSIS — M9903 Segmental and somatic dysfunction of lumbar region: Secondary | ICD-10-CM | POA: Diagnosis not present

## 2015-07-15 DIAGNOSIS — M545 Low back pain: Secondary | ICD-10-CM | POA: Diagnosis not present

## 2015-07-15 DIAGNOSIS — M9901 Segmental and somatic dysfunction of cervical region: Secondary | ICD-10-CM | POA: Diagnosis not present

## 2015-07-15 DIAGNOSIS — M9902 Segmental and somatic dysfunction of thoracic region: Secondary | ICD-10-CM | POA: Diagnosis not present

## 2015-07-17 DIAGNOSIS — M9901 Segmental and somatic dysfunction of cervical region: Secondary | ICD-10-CM | POA: Diagnosis not present

## 2015-07-17 DIAGNOSIS — M545 Low back pain: Secondary | ICD-10-CM | POA: Diagnosis not present

## 2015-07-17 DIAGNOSIS — M9902 Segmental and somatic dysfunction of thoracic region: Secondary | ICD-10-CM | POA: Diagnosis not present

## 2015-07-17 DIAGNOSIS — M9903 Segmental and somatic dysfunction of lumbar region: Secondary | ICD-10-CM | POA: Diagnosis not present

## 2015-07-22 DIAGNOSIS — M9901 Segmental and somatic dysfunction of cervical region: Secondary | ICD-10-CM | POA: Diagnosis not present

## 2015-07-22 DIAGNOSIS — M545 Low back pain: Secondary | ICD-10-CM | POA: Diagnosis not present

## 2015-07-22 DIAGNOSIS — M9903 Segmental and somatic dysfunction of lumbar region: Secondary | ICD-10-CM | POA: Diagnosis not present

## 2015-07-22 DIAGNOSIS — M9902 Segmental and somatic dysfunction of thoracic region: Secondary | ICD-10-CM | POA: Diagnosis not present

## 2015-07-24 DIAGNOSIS — M9902 Segmental and somatic dysfunction of thoracic region: Secondary | ICD-10-CM | POA: Diagnosis not present

## 2015-07-24 DIAGNOSIS — M545 Low back pain: Secondary | ICD-10-CM | POA: Diagnosis not present

## 2015-07-24 DIAGNOSIS — M9901 Segmental and somatic dysfunction of cervical region: Secondary | ICD-10-CM | POA: Diagnosis not present

## 2015-07-24 DIAGNOSIS — M9903 Segmental and somatic dysfunction of lumbar region: Secondary | ICD-10-CM | POA: Diagnosis not present

## 2015-08-07 DIAGNOSIS — M9902 Segmental and somatic dysfunction of thoracic region: Secondary | ICD-10-CM | POA: Diagnosis not present

## 2015-08-07 DIAGNOSIS — M9903 Segmental and somatic dysfunction of lumbar region: Secondary | ICD-10-CM | POA: Diagnosis not present

## 2015-08-07 DIAGNOSIS — M9901 Segmental and somatic dysfunction of cervical region: Secondary | ICD-10-CM | POA: Diagnosis not present

## 2015-08-07 DIAGNOSIS — M545 Low back pain: Secondary | ICD-10-CM | POA: Diagnosis not present

## 2015-08-14 DIAGNOSIS — M545 Low back pain: Secondary | ICD-10-CM | POA: Diagnosis not present

## 2015-08-14 DIAGNOSIS — M9901 Segmental and somatic dysfunction of cervical region: Secondary | ICD-10-CM | POA: Diagnosis not present

## 2015-08-14 DIAGNOSIS — M9902 Segmental and somatic dysfunction of thoracic region: Secondary | ICD-10-CM | POA: Diagnosis not present

## 2015-08-14 DIAGNOSIS — M9903 Segmental and somatic dysfunction of lumbar region: Secondary | ICD-10-CM | POA: Diagnosis not present

## 2015-08-21 DIAGNOSIS — M545 Low back pain: Secondary | ICD-10-CM | POA: Diagnosis not present

## 2015-08-21 DIAGNOSIS — M9901 Segmental and somatic dysfunction of cervical region: Secondary | ICD-10-CM | POA: Diagnosis not present

## 2015-08-21 DIAGNOSIS — M9902 Segmental and somatic dysfunction of thoracic region: Secondary | ICD-10-CM | POA: Diagnosis not present

## 2015-08-21 DIAGNOSIS — M9903 Segmental and somatic dysfunction of lumbar region: Secondary | ICD-10-CM | POA: Diagnosis not present

## 2015-08-30 DIAGNOSIS — M545 Low back pain: Secondary | ICD-10-CM | POA: Diagnosis not present

## 2015-08-30 DIAGNOSIS — M9901 Segmental and somatic dysfunction of cervical region: Secondary | ICD-10-CM | POA: Diagnosis not present

## 2015-08-30 DIAGNOSIS — M9902 Segmental and somatic dysfunction of thoracic region: Secondary | ICD-10-CM | POA: Diagnosis not present

## 2015-08-30 DIAGNOSIS — M9903 Segmental and somatic dysfunction of lumbar region: Secondary | ICD-10-CM | POA: Diagnosis not present

## 2015-09-18 DIAGNOSIS — N951 Menopausal and female climacteric states: Secondary | ICD-10-CM | POA: Diagnosis not present

## 2015-09-18 DIAGNOSIS — Z23 Encounter for immunization: Secondary | ICD-10-CM | POA: Diagnosis not present

## 2015-09-18 DIAGNOSIS — M79605 Pain in left leg: Secondary | ICD-10-CM | POA: Diagnosis not present

## 2015-09-18 DIAGNOSIS — E785 Hyperlipidemia, unspecified: Secondary | ICD-10-CM | POA: Diagnosis not present

## 2015-09-24 DIAGNOSIS — H40013 Open angle with borderline findings, low risk, bilateral: Secondary | ICD-10-CM | POA: Diagnosis not present

## 2016-03-02 DIAGNOSIS — J0100 Acute maxillary sinusitis, unspecified: Secondary | ICD-10-CM | POA: Diagnosis not present

## 2016-06-11 DIAGNOSIS — H6982 Other specified disorders of Eustachian tube, left ear: Secondary | ICD-10-CM | POA: Diagnosis not present

## 2016-10-08 ENCOUNTER — Ambulatory Visit (INDEPENDENT_AMBULATORY_CARE_PROVIDER_SITE_OTHER): Payer: Medicare Other

## 2016-10-08 ENCOUNTER — Encounter: Payer: Self-pay | Admitting: Podiatry

## 2016-10-08 ENCOUNTER — Ambulatory Visit (INDEPENDENT_AMBULATORY_CARE_PROVIDER_SITE_OTHER): Payer: Medicare Other | Admitting: Podiatry

## 2016-10-08 VITALS — BP 106/61 | HR 93 | Resp 16 | Ht 64.0 in | Wt 160.0 lb

## 2016-10-08 DIAGNOSIS — M79672 Pain in left foot: Secondary | ICD-10-CM | POA: Diagnosis not present

## 2016-10-08 DIAGNOSIS — M722 Plantar fascial fibromatosis: Secondary | ICD-10-CM

## 2016-10-08 MED ORDER — TRIAMCINOLONE ACETONIDE 10 MG/ML IJ SUSP
10.0000 mg | Freq: Once | INTRAMUSCULAR | Status: AC
  Administered 2016-10-08: 10 mg

## 2016-10-08 NOTE — Progress Notes (Signed)
Subjective:     Patient ID: [name], female   DOB: [birth year], 70 y.o.   MRN: [id number]  HPI patient presents stating she's having a lot of pain in the plantar left heel and it's been present for several months and she's tried supportive shoe gear and reduced activity without relief   Review of Systems     Objective:   Physical Exam Neurovascular status intact with patient found to have intense discomfort plantar aspect left heel at the insertional point of the tendon into the calcaneus    Assessment:     Inflammation fluid buildup around the medial band left plantar fascia with intense discomfort upon palpation    Plan:     H&P x-rays reviewed and injected the plantar fascial left 3 mg Kenalog 5 mg Xylocaine and applied fascial brace with instructions on usage and begin anti-inflammatory usage. Reappoint for [place] to recheck again in 2 weeks  X-ray report indicate spur formation with no indication of stress fracture arthritis

## 2016-10-08 NOTE — Patient Instructions (Signed)

## 2016-10-08 NOTE — Progress Notes (Signed)
   Subjective:    Patient ID: [name], female    DOB: Apr 04, 1946, 70 y.o.   MRN: [id number]  HPI Chief Complaint  Patient presents with  . Foot Pain    Left foot; heel & lateral side of heel; x3 months      Review of Systems  All other systems reviewed and are negative.      Objective:   Physical Exam        Assessment & Plan:

## 2016-10-13 DIAGNOSIS — Z23 Encounter for immunization: Secondary | ICD-10-CM | POA: Diagnosis not present

## 2016-10-22 ENCOUNTER — Encounter: Payer: Self-pay | Admitting: Podiatry

## 2016-10-22 ENCOUNTER — Ambulatory Visit (INDEPENDENT_AMBULATORY_CARE_PROVIDER_SITE_OTHER): Payer: Medicare Other | Admitting: Podiatry

## 2016-10-22 DIAGNOSIS — M722 Plantar fascial fibromatosis: Secondary | ICD-10-CM | POA: Diagnosis not present

## 2016-10-22 DIAGNOSIS — M79672 Pain in left foot: Secondary | ICD-10-CM

## 2016-10-22 MED ORDER — TRIAMCINOLONE ACETONIDE 10 MG/ML IJ SUSP
10.0000 mg | Freq: Once | INTRAMUSCULAR | Status: AC
  Administered 2016-10-22: 10 mg

## 2016-10-22 NOTE — Patient Instructions (Signed)

## 2016-10-26 DIAGNOSIS — L821 Other seborrheic keratosis: Secondary | ICD-10-CM | POA: Diagnosis not present

## 2016-10-26 DIAGNOSIS — L820 Inflamed seborrheic keratosis: Secondary | ICD-10-CM | POA: Diagnosis not present

## 2016-11-01 NOTE — Progress Notes (Signed)
Subjective:     Patient ID: [name], female   DOB: 07-12-1946, 70 y.o.   MRN: [id number]  HPI patient presents stating that she is improving but she still is having area of discomfort on the left heel   Review of Systems     Objective:   Physical Exam Neurovascular status intact with significant discomfort plantar aspect left heel at the insertional point tendon into the calcaneus with inflammation fluid buildup noted    Assessment:     Plantar fasciitis left still present but improved    Plan:     Reinjected the plantar fascial left 3 mg Kenalog 5 mg Xylocaine and instructed on physical therapy supportive shoes and not going barefoot and will be seen back as needed

## 2017-07-07 DIAGNOSIS — E7800 Pure hypercholesterolemia, unspecified: Secondary | ICD-10-CM | POA: Diagnosis not present

## 2017-07-07 DIAGNOSIS — H60331 Swimmer's ear, right ear: Secondary | ICD-10-CM | POA: Diagnosis not present

## 2017-07-07 DIAGNOSIS — N951 Menopausal and female climacteric states: Secondary | ICD-10-CM | POA: Diagnosis not present

## 2017-07-07 DIAGNOSIS — Z1389 Encounter for screening for other disorder: Secondary | ICD-10-CM | POA: Diagnosis not present

## 2017-07-20 ENCOUNTER — Other Ambulatory Visit: Payer: Self-pay | Admitting: Obstetrics and Gynecology

## 2017-07-20 DIAGNOSIS — Z1231 Encounter for screening mammogram for malignant neoplasm of breast: Secondary | ICD-10-CM

## 2017-07-27 ENCOUNTER — Ambulatory Visit
Admission: RE | Admit: 2017-07-27 | Discharge: 2017-07-27 | Disposition: A | Discharge status: Home / Self Care | Payer: Medicare Other | Source: Ambulatory Visit | Attending: Obstetrics and Gynecology | Admitting: Obstetrics and Gynecology

## 2017-07-27 DIAGNOSIS — Z1231 Encounter for screening mammogram for malignant neoplasm of breast: Secondary | ICD-10-CM | POA: Diagnosis not present

## 2017-09-20 DIAGNOSIS — Z23 Encounter for immunization: Secondary | ICD-10-CM | POA: Diagnosis not present

## 2018-02-01 ENCOUNTER — Ambulatory Visit
Admission: RE | Admit: 2018-02-01 | Discharge: 2018-02-01 | Disposition: A | Discharge status: Home / Self Care | Payer: Medicare Other | Source: Ambulatory Visit | Attending: Family Medicine | Admitting: Family Medicine

## 2018-02-01 ENCOUNTER — Other Ambulatory Visit: Payer: Self-pay | Admitting: Family Medicine

## 2018-02-01 DIAGNOSIS — R0781 Pleurodynia: Secondary | ICD-10-CM

## 2018-02-01 DIAGNOSIS — R0789 Other chest pain: Secondary | ICD-10-CM | POA: Diagnosis not present

## 2018-02-01 DIAGNOSIS — N951 Menopausal and female climacteric states: Secondary | ICD-10-CM | POA: Diagnosis not present

## 2018-02-01 DIAGNOSIS — M79601 Pain in right arm: Secondary | ICD-10-CM | POA: Diagnosis not present

## 2018-03-29 DIAGNOSIS — M8588 Other specified disorders of bone density and structure, other site: Secondary | ICD-10-CM | POA: Diagnosis not present

## 2018-03-29 DIAGNOSIS — Z780 Asymptomatic menopausal state: Secondary | ICD-10-CM | POA: Diagnosis not present

## 2018-05-05 DIAGNOSIS — Z961 Presence of intraocular lens: Secondary | ICD-10-CM | POA: Diagnosis not present

## 2018-11-01 DIAGNOSIS — Z23 Encounter for immunization: Secondary | ICD-10-CM | POA: Diagnosis not present

## 2018-11-28 DIAGNOSIS — Z23 Encounter for immunization: Secondary | ICD-10-CM | POA: Diagnosis not present

## 2018-11-28 DIAGNOSIS — K219 Gastro-esophageal reflux disease without esophagitis: Secondary | ICD-10-CM | POA: Diagnosis not present

## 2018-11-28 DIAGNOSIS — E7800 Pure hypercholesterolemia, unspecified: Secondary | ICD-10-CM | POA: Diagnosis not present

## 2018-11-28 DIAGNOSIS — N951 Menopausal and female climacteric states: Secondary | ICD-10-CM | POA: Diagnosis not present

## 2018-11-29 DIAGNOSIS — N644 Mastodynia: Secondary | ICD-10-CM | POA: Diagnosis not present

## 2018-11-29 DIAGNOSIS — Z1231 Encounter for screening mammogram for malignant neoplasm of breast: Secondary | ICD-10-CM | POA: Diagnosis not present

## 2018-11-29 DIAGNOSIS — Z01419 Encounter for gynecological examination (general) (routine) without abnormal findings: Secondary | ICD-10-CM | POA: Diagnosis not present

## 2018-11-29 DIAGNOSIS — Z6829 Body mass index (BMI) 29.0-29.9, adult: Secondary | ICD-10-CM | POA: Diagnosis not present

## 2018-11-30 ENCOUNTER — Other Ambulatory Visit: Payer: Self-pay | Admitting: Obstetrics and Gynecology

## 2018-11-30 DIAGNOSIS — N644 Mastodynia: Secondary | ICD-10-CM

## 2018-12-05 ENCOUNTER — Ambulatory Visit: Payer: Medicare Other

## 2018-12-05 ENCOUNTER — Ambulatory Visit
Admission: RE | Admit: 2018-12-05 | Discharge: 2018-12-05 | Disposition: A | Discharge status: Home / Self Care | Payer: Medicare Other | Source: Ambulatory Visit | Attending: Obstetrics and Gynecology | Admitting: Obstetrics and Gynecology

## 2018-12-05 DIAGNOSIS — N644 Mastodynia: Secondary | ICD-10-CM

## 2019-06-02 DIAGNOSIS — E7800 Pure hypercholesterolemia, unspecified: Secondary | ICD-10-CM | POA: Diagnosis not present

## 2019-06-02 DIAGNOSIS — N951 Menopausal and female climacteric states: Secondary | ICD-10-CM | POA: Diagnosis not present

## 2019-10-11 ENCOUNTER — Other Ambulatory Visit: Payer: Self-pay | Admitting: Obstetrics and Gynecology

## 2019-10-11 DIAGNOSIS — Z1231 Encounter for screening mammogram for malignant neoplasm of breast: Secondary | ICD-10-CM

## 2019-10-16 ENCOUNTER — Other Ambulatory Visit: Payer: Self-pay

## 2019-10-16 DIAGNOSIS — Z20822 Contact with and (suspected) exposure to covid-19: Secondary | ICD-10-CM

## 2019-10-16 DIAGNOSIS — Z20828 Contact with and (suspected) exposure to other viral communicable diseases: Secondary | ICD-10-CM | POA: Diagnosis not present

## 2019-10-19 LAB — NOVEL CORONAVIRUS, NAA: SARS-CoV-2, NAA: DETECTED — AB

## 2019-11-24 DIAGNOSIS — Z23 Encounter for immunization: Secondary | ICD-10-CM | POA: Diagnosis not present

## 2019-12-05 DIAGNOSIS — Z1231 Encounter for screening mammogram for malignant neoplasm of breast: Secondary | ICD-10-CM | POA: Diagnosis not present

## 2019-12-11 DIAGNOSIS — R102 Pelvic and perineal pain: Secondary | ICD-10-CM | POA: Diagnosis not present

## 2020-02-25 ENCOUNTER — Ambulatory Visit: Payer: Medicare Other | Attending: Internal Medicine

## 2020-02-25 DIAGNOSIS — Z23 Encounter for immunization: Secondary | ICD-10-CM | POA: Insufficient documentation

## 2020-02-25 NOTE — Progress Notes (Signed)
   Covid-19 Vaccination Clinic  Name:  [name]    MRN: [id number] DOB: [birth year]  02/25/2020  [name] was observed post Covid-19 immunization for 15 minutes without incidence. She was provided with Vaccine Information Sheet and instruction to access the V-Safe system.   [name] was instructed to call 911 with any severe reactions post vaccine: Marland Kitchen Difficulty breathing  . Swelling of your face and throat  . A fast heartbeat  . A bad rash all over your body  . Dizziness and weakness    Immunizations Administered    Name Date Dose VIS Date Route   Pfizer COVID-19 Vaccine 02/25/2020 10:22 AM 0.3 mL 12/08/2019 Intramuscular   Manufacturer: [place]   Lot: [id number]   [place]: [phone]

## 2020-03-20 ENCOUNTER — Ambulatory Visit: Payer: Medicare Other | Attending: Internal Medicine

## 2020-03-20 DIAGNOSIS — Z23 Encounter for immunization: Secondary | ICD-10-CM

## 2020-03-20 NOTE — Progress Notes (Signed)
   Covid-19 Vaccination Clinic  Name:  [name]    MRN: [id number] DOB: [birth year]  03/20/2020  [name] was observed post Covid-19 immunization for 15 minutes without incident. She was provided with Vaccine Information Sheet and instruction to access the V-Safe system.   [name] was instructed to call 911 with any severe reactions post vaccine: Marland Kitchen Difficulty breathing  . Swelling of face and throat  . A fast heartbeat  . A bad rash all over body  . Dizziness and weakness   Immunizations Administered    Name Date Dose VIS Date Route   Pfizer COVID-19 Vaccine 03/20/2020  1:10 PM 0.3 mL 12/08/2019 Intramuscular   Manufacturer: [place]   Lot: [id number]   [place]: [id number]

## 2020-05-23 ENCOUNTER — Other Ambulatory Visit: Payer: Self-pay | Admitting: Family Medicine

## 2020-05-23 DIAGNOSIS — K219 Gastro-esophageal reflux disease without esophagitis: Secondary | ICD-10-CM | POA: Diagnosis not present

## 2020-05-23 DIAGNOSIS — Z1159 Encounter for screening for other viral diseases: Secondary | ICD-10-CM | POA: Diagnosis not present

## 2020-05-23 DIAGNOSIS — E7800 Pure hypercholesterolemia, unspecified: Secondary | ICD-10-CM | POA: Diagnosis not present

## 2020-05-23 DIAGNOSIS — Z1239 Encounter for other screening for malignant neoplasm of breast: Secondary | ICD-10-CM | POA: Diagnosis not present

## 2020-05-23 DIAGNOSIS — M8580 Other specified disorders of bone density and structure, unspecified site: Secondary | ICD-10-CM | POA: Diagnosis not present

## 2020-05-23 DIAGNOSIS — Z1211 Encounter for screening for malignant neoplasm of colon: Secondary | ICD-10-CM | POA: Diagnosis not present

## 2020-05-23 DIAGNOSIS — Z1231 Encounter for screening mammogram for malignant neoplasm of breast: Secondary | ICD-10-CM

## 2020-05-23 DIAGNOSIS — N951 Menopausal and female climacteric states: Secondary | ICD-10-CM | POA: Diagnosis not present

## 2020-05-23 DIAGNOSIS — Z0000 Encounter for general adult medical examination without abnormal findings: Secondary | ICD-10-CM | POA: Diagnosis not present

## 2020-07-23 DIAGNOSIS — H6982 Other specified disorders of Eustachian tube, left ear: Secondary | ICD-10-CM | POA: Diagnosis not present

## 2020-08-02 ENCOUNTER — Other Ambulatory Visit: Payer: Self-pay | Admitting: Orthopedic Surgery

## 2020-08-05 NOTE — Patient Instructions (Signed)
DUE TO COVID-19 ONLY ONE VISITOR IS ALLOWED TO COME WITH YOU AND STAY IN THE WAITING ROOM ONLY DURING PRE OP AND  PROCEDURE.   IF YOU WILL BE ADMITTED INTO THE HOSPITAL YOU ARE ALLOWED ONE SUPPORT PERSON DURING VISITATION HOURS ONLY (10AM -8PM)   . The support person may change daily. . The support person must pass our screening, gel in and out, and wear a mask at all times, including in the patient's room. . Patients must also wear a mask when staff or their support person are in the room.   COVID SWAB TESTING MUST BE COMPLETED ON:  Tuesday, 08-20-20 @    [address] [place], [place] [address]  (Must self quarantine after testing. Follow instructions on handout.)        Your procedure is scheduled on: Friday, 08-23-20   Report to [facility] Main  Entrance   Report to Short Stay at 5:30 AM   [facility])     Call this number if you have problems the morning of surgery [phone]   Do not eat food :After Midnight.   May have liquids until    day of surgery  CLEAR LIQUID DIET  Foods Allowed                                                                     Foods Excluded  Water, Black Coffee and tea, regular and decaf          liquids that you cannot  Plain Jell-O in any flavor  (No red)                                      see through such as: Fruit ices (not with fruit pulp)                                      milk, soups, orange juice              Iced Popsicles (No red)                                      All solid food                                   Apple juices Sports drinks like Gatorade (No red) Lightly seasoned clear broth or consume(fat free) Sugar, honey syrup    Complete one Ensure drink the morning of surgery at       the day of surgery.    Oral Hygiene is also important to reduce your risk of infection.                                     Remember - BRUSH YOUR TEETH THE MORNING OF SURGERY WITH YOUR REGULAR TOOTHPASTE   Do NOT smoke after  Midnight   Take these medicines the morning of surgery with A SIP OF WATER:  Paroxetine and Simvastatin                                You may not have any metal on your body including hair pins, jewelry, and body piercings             Do not wear make-up, lotions, powders, perfumes/cologne, or deodorant             Do not wear nail polish.  Do not shave  48 hours prior to surgery.        Do not bring valuables to the hospital. [place].   Contacts, dentures or bridgework may not be worn into surgery.   Bring small overnight bag day of surgery.     Special Instructions: Bring a copy of your healthcare power of attorney and living will documents the day of surgery if you haven't scanned them in  before.              Please read over the following fact sheets you were given: IF YOU HAVE QUESTIONS ABOUT YOUR PRE OP INSTRUCTIONS PLEASE CALL  [phone]   [place] - Preparing for Surgery Before surgery, you can play an important role.  Because skin is not sterile, your skin needs to be as free of germs as possible.  You can reduce the number of germs on your skin by washing with CHG (chlorahexidine gluconate) soap before surgery.  CHG is an antiseptic cleaner which kills germs and bonds with the skin to continue killing germs even after washing. Please DO NOT use if you have an allergy to CHG or antibacterial soaps.  If your skin becomes reddened/irritated stop using the CHG and inform your nurse when you arrive at Short Stay. Do not shave (including legs and underarms) for at least 48 hours prior to the first CHG shower.  You may shave your face/neck.  Please follow these instructions carefully:  1.  Shower with CHG Soap the night before surgery and the  morning of surgery.  2.  If you choose to wash your hair, wash your hair first as usual with your normal  shampoo.  3.  After you shampoo, rinse your hair and body thoroughly to remove the shampoo.                              4.  Use CHG as you would any other liquid soap.  You can apply chg directly to the skin and wash.  Gently with a scrungie or clean washcloth.  5.  Apply the CHG Soap to your body ONLY FROM THE NECK DOWN.   Do   not use on face/ open                           Wound or open sores. Avoid contact with eyes, ears mouth and   genitals (private parts).                       Wash face,  Genitals (private parts) with your normal soap.             6.  Wash thoroughly, paying special attention to the area where your  surgery  will be performed.  7.  Thoroughly rinse your body with warm water from the neck down.  8.  DO NOT shower/wash with your normal soap after using and rinsing off the CHG Soap.                9.  Pat yourself dry with a clean towel.            10.  Wear clean pajamas.            11.  Place clean sheets on your bed the night of your first shower and do not  sleep with pets. Day of Surgery : Do not apply any lotions/deodorants the morning of surgery.  Please wear clean clothes to the hospital/surgery center.  FAILURE TO FOLLOW THESE INSTRUCTIONS MAY RESULT IN THE CANCELLATION OF YOUR SURGERY  PATIENT SIGNATURE_________________________________  NURSE SIGNATURE__________________________________  ________________________________________________________________________

## 2020-08-05 NOTE — Progress Notes (Signed)
Please enter orders for PAT visit 08-12-20 for surgery scheduled 08-23-20. Thank you

## 2020-08-05 NOTE — Progress Notes (Signed)
COVID Vaccine Completed: x2 Date COVID Vaccine completed: 02-25-20 & 03-20-20 COVID vaccine manufacturer: [place]   PCP - Shirline Frees, MD Cardiologist -   Chest x-ray -  EKG -  Stress Test -  ECHO -  Cardiac Cath -   Sleep Study -  CPAP -   Fasting Blood Sugar -  Checks Blood Sugar _____ times a day  Blood Thinner Instructions: Aspirin Instructions: Last Dose:  Anesthesia review:   Patient denies shortness of breath, fever, cough and chest pain at PAT appointment   Patient verbalized understanding of instructions that were given to them at the PAT appointment. Patient was also instructed that they will need to review over the PAT instructions again at home before surgery.

## 2020-08-12 ENCOUNTER — Inpatient Hospital Stay (HOSPITAL_COMMUNITY)
Admission: RE | Admit: 2020-08-12 | Discharge: 2020-08-12 | Disposition: A | Discharge status: Home / Self Care | Payer: Medicare Other | Source: Ambulatory Visit

## 2020-08-23 ENCOUNTER — Ambulatory Visit: Admit: 2020-08-23 | Payer: Medicare Other | Admitting: Orthopedic Surgery

## 2020-08-23 SURGERY — ARTHROPLASTY, KNEE, TOTAL
Anesthesia: Spinal | Site: Knee | Laterality: Right

## 2020-08-26 ENCOUNTER — Ambulatory Visit
Admission: RE | Admit: 2020-08-26 | Discharge: 2020-08-26 | Disposition: A | Discharge status: Home / Self Care | Payer: Medicare Other | Source: Ambulatory Visit | Attending: Family Medicine | Admitting: Family Medicine

## 2020-08-26 ENCOUNTER — Other Ambulatory Visit: Payer: Self-pay

## 2020-08-26 DIAGNOSIS — Z1231 Encounter for screening mammogram for malignant neoplasm of breast: Secondary | ICD-10-CM

## 2020-08-26 DIAGNOSIS — Z780 Asymptomatic menopausal state: Secondary | ICD-10-CM | POA: Diagnosis not present

## 2020-08-26 DIAGNOSIS — M8580 Other specified disorders of bone density and structure, unspecified site: Secondary | ICD-10-CM

## 2020-08-26 DIAGNOSIS — M85832 Other specified disorders of bone density and structure, left forearm: Secondary | ICD-10-CM | POA: Diagnosis not present

## 2020-09-28 DIAGNOSIS — Z03818 Encounter for observation for suspected exposure to other biological agents ruled out: Secondary | ICD-10-CM | POA: Diagnosis not present

## 2020-09-28 DIAGNOSIS — J019 Acute sinusitis, unspecified: Secondary | ICD-10-CM | POA: Diagnosis not present

## 2020-10-10 DIAGNOSIS — Z1159 Encounter for screening for other viral diseases: Secondary | ICD-10-CM | POA: Diagnosis not present

## 2020-10-15 DIAGNOSIS — D124 Benign neoplasm of descending colon: Secondary | ICD-10-CM | POA: Diagnosis not present

## 2020-10-15 DIAGNOSIS — Z86010 Personal history of colonic polyps: Secondary | ICD-10-CM | POA: Diagnosis not present

## 2020-10-15 DIAGNOSIS — K635 Polyp of colon: Secondary | ICD-10-CM | POA: Diagnosis not present

## 2020-10-15 DIAGNOSIS — K621 Rectal polyp: Secondary | ICD-10-CM | POA: Diagnosis not present

## 2020-10-15 DIAGNOSIS — D125 Benign neoplasm of sigmoid colon: Secondary | ICD-10-CM | POA: Diagnosis not present

## 2020-10-15 DIAGNOSIS — K5730 Diverticulosis of large intestine without perforation or abscess without bleeding: Secondary | ICD-10-CM | POA: Diagnosis not present

## 2020-10-18 DIAGNOSIS — K621 Rectal polyp: Secondary | ICD-10-CM | POA: Diagnosis not present

## 2020-10-18 DIAGNOSIS — D125 Benign neoplasm of sigmoid colon: Secondary | ICD-10-CM | POA: Diagnosis not present

## 2020-10-18 DIAGNOSIS — K635 Polyp of colon: Secondary | ICD-10-CM | POA: Diagnosis not present

## 2020-10-18 DIAGNOSIS — D124 Benign neoplasm of descending colon: Secondary | ICD-10-CM | POA: Diagnosis not present

## 2021-01-03 DIAGNOSIS — G4719 Other hypersomnia: Secondary | ICD-10-CM | POA: Diagnosis not present

## 2021-01-03 DIAGNOSIS — E782 Mixed hyperlipidemia: Secondary | ICD-10-CM | POA: Diagnosis not present

## 2021-01-15 DIAGNOSIS — Z01419 Encounter for gynecological examination (general) (routine) without abnormal findings: Secondary | ICD-10-CM | POA: Diagnosis not present

## 2021-01-15 DIAGNOSIS — Z6829 Body mass index (BMI) 29.0-29.9, adult: Secondary | ICD-10-CM | POA: Diagnosis not present

## 2021-01-20 DIAGNOSIS — K219 Gastro-esophageal reflux disease without esophagitis: Secondary | ICD-10-CM | POA: Diagnosis not present

## 2021-01-20 DIAGNOSIS — G4733 Obstructive sleep apnea (adult) (pediatric): Secondary | ICD-10-CM | POA: Diagnosis not present

## 2021-01-20 DIAGNOSIS — E7800 Pure hypercholesterolemia, unspecified: Secondary | ICD-10-CM | POA: Diagnosis not present

## 2021-01-22 DIAGNOSIS — M545 Low back pain, unspecified: Secondary | ICD-10-CM | POA: Diagnosis not present

## 2021-01-22 DIAGNOSIS — M25552 Pain in left hip: Secondary | ICD-10-CM | POA: Diagnosis not present

## 2021-01-22 DIAGNOSIS — M25562 Pain in left knee: Secondary | ICD-10-CM | POA: Diagnosis not present

## 2021-03-05 DIAGNOSIS — M545 Low back pain, unspecified: Secondary | ICD-10-CM | POA: Diagnosis not present

## 2021-03-05 DIAGNOSIS — M25562 Pain in left knee: Secondary | ICD-10-CM | POA: Diagnosis not present

## 2021-04-01 DIAGNOSIS — M79604 Pain in right leg: Secondary | ICD-10-CM | POA: Diagnosis not present

## 2021-04-01 DIAGNOSIS — M79605 Pain in left leg: Secondary | ICD-10-CM | POA: Diagnosis not present

## 2021-04-14 DIAGNOSIS — K219 Gastro-esophageal reflux disease without esophagitis: Secondary | ICD-10-CM | POA: Diagnosis not present

## 2021-04-14 DIAGNOSIS — G4733 Obstructive sleep apnea (adult) (pediatric): Secondary | ICD-10-CM | POA: Diagnosis not present

## 2021-04-14 DIAGNOSIS — J309 Allergic rhinitis, unspecified: Secondary | ICD-10-CM | POA: Diagnosis not present

## 2021-04-21 DIAGNOSIS — M1712 Unilateral primary osteoarthritis, left knee: Secondary | ICD-10-CM | POA: Diagnosis not present

## 2021-04-21 DIAGNOSIS — M7652 Patellar tendinitis, left knee: Secondary | ICD-10-CM | POA: Diagnosis not present

## 2021-04-21 DIAGNOSIS — M25562 Pain in left knee: Secondary | ICD-10-CM | POA: Diagnosis not present

## 2021-04-21 DIAGNOSIS — M25462 Effusion, left knee: Secondary | ICD-10-CM | POA: Diagnosis not present

## 2021-05-07 DIAGNOSIS — Z961 Presence of intraocular lens: Secondary | ICD-10-CM | POA: Diagnosis not present

## 2021-05-27 DIAGNOSIS — G4733 Obstructive sleep apnea (adult) (pediatric): Secondary | ICD-10-CM | POA: Diagnosis not present

## 2021-05-27 DIAGNOSIS — E7800 Pure hypercholesterolemia, unspecified: Secondary | ICD-10-CM | POA: Diagnosis not present

## 2021-05-27 DIAGNOSIS — R413 Other amnesia: Secondary | ICD-10-CM | POA: Diagnosis not present

## 2021-05-27 DIAGNOSIS — Z0000 Encounter for general adult medical examination without abnormal findings: Secondary | ICD-10-CM | POA: Diagnosis not present

## 2021-05-27 DIAGNOSIS — N951 Menopausal and female climacteric states: Secondary | ICD-10-CM | POA: Diagnosis not present

## 2021-05-27 DIAGNOSIS — K219 Gastro-esophageal reflux disease without esophagitis: Secondary | ICD-10-CM | POA: Diagnosis not present

## 2021-08-26 DIAGNOSIS — J309 Allergic rhinitis, unspecified: Secondary | ICD-10-CM | POA: Diagnosis not present

## 2021-08-26 DIAGNOSIS — E782 Mixed hyperlipidemia: Secondary | ICD-10-CM | POA: Diagnosis not present

## 2021-08-26 DIAGNOSIS — G4733 Obstructive sleep apnea (adult) (pediatric): Secondary | ICD-10-CM | POA: Diagnosis not present

## 2021-10-24 ENCOUNTER — Other Ambulatory Visit: Payer: Self-pay | Admitting: Family Medicine

## 2021-10-24 DIAGNOSIS — Z1231 Encounter for screening mammogram for malignant neoplasm of breast: Secondary | ICD-10-CM

## 2021-11-14 DIAGNOSIS — Z23 Encounter for immunization: Secondary | ICD-10-CM | POA: Diagnosis not present

## 2021-11-28 ENCOUNTER — Ambulatory Visit
Admission: RE | Admit: 2021-11-28 | Discharge: 2021-11-28 | Disposition: A | Discharge status: Home / Self Care | Payer: Medicare Other | Source: Ambulatory Visit | Attending: Family Medicine | Admitting: Family Medicine

## 2021-11-28 DIAGNOSIS — Z1231 Encounter for screening mammogram for malignant neoplasm of breast: Secondary | ICD-10-CM

## 2022-02-23 DIAGNOSIS — E782 Mixed hyperlipidemia: Secondary | ICD-10-CM | POA: Diagnosis not present

## 2022-02-23 DIAGNOSIS — G4733 Obstructive sleep apnea (adult) (pediatric): Secondary | ICD-10-CM | POA: Diagnosis not present

## 2022-02-23 DIAGNOSIS — J309 Allergic rhinitis, unspecified: Secondary | ICD-10-CM | POA: Diagnosis not present

## 2022-03-19 DIAGNOSIS — J019 Acute sinusitis, unspecified: Secondary | ICD-10-CM | POA: Diagnosis not present

## 2022-03-19 DIAGNOSIS — R0982 Postnasal drip: Secondary | ICD-10-CM | POA: Diagnosis not present

## 2022-03-19 DIAGNOSIS — J988 Other specified respiratory disorders: Secondary | ICD-10-CM | POA: Diagnosis not present

## 2022-03-19 DIAGNOSIS — R448 Other symptoms and signs involving general sensations and perceptions: Secondary | ICD-10-CM | POA: Diagnosis not present

## 2022-04-24 DIAGNOSIS — J069 Acute upper respiratory infection, unspecified: Secondary | ICD-10-CM | POA: Diagnosis not present

## 2022-07-05 ENCOUNTER — Other Ambulatory Visit: Payer: Self-pay

## 2022-07-05 ENCOUNTER — Emergency Department (HOSPITAL_COMMUNITY): Payer: Medicare HMO

## 2022-07-05 ENCOUNTER — Encounter (HOSPITAL_COMMUNITY): Payer: Self-pay

## 2022-07-05 ENCOUNTER — Observation Stay (HOSPITAL_COMMUNITY)
Admission: EM | Admit: 2022-07-05 | Discharge: 2022-07-06 | Disposition: A | Discharge status: Home / Self Care | Payer: Medicare HMO | Attending: Emergency Medicine | Admitting: Emergency Medicine

## 2022-07-05 DIAGNOSIS — R072 Precordial pain: Secondary | ICD-10-CM | POA: Diagnosis not present

## 2022-07-05 DIAGNOSIS — R110 Nausea: Secondary | ICD-10-CM | POA: Diagnosis not present

## 2022-07-05 DIAGNOSIS — E785 Hyperlipidemia, unspecified: Secondary | ICD-10-CM | POA: Diagnosis present

## 2022-07-05 DIAGNOSIS — R0602 Shortness of breath: Secondary | ICD-10-CM | POA: Diagnosis not present

## 2022-07-05 DIAGNOSIS — R9431 Abnormal electrocardiogram [ECG] [EKG]: Secondary | ICD-10-CM | POA: Diagnosis not present

## 2022-07-05 DIAGNOSIS — G3184 Mild cognitive impairment, so stated: Secondary | ICD-10-CM | POA: Diagnosis present

## 2022-07-05 DIAGNOSIS — R079 Chest pain, unspecified: Secondary | ICD-10-CM | POA: Diagnosis not present

## 2022-07-05 HISTORY — DX: Gastro-esophageal reflux disease without esophagitis: K21.9

## 2022-07-05 HISTORY — DX: Obstructive sleep apnea (adult) (pediatric): G47.33

## 2022-07-05 HISTORY — DX: Pure hypercholesterolemia, unspecified: E78.00

## 2022-07-05 LAB — TROPONIN I (HIGH SENSITIVITY)
Troponin I (High Sensitivity): 7 ng/L (ref <18)
Troponin I (High Sensitivity): 4 ng/L (ref <18)

## 2022-07-05 LAB — BASIC METABOLIC PANEL
Anion gap: 9 (ref 5–15)
BUN: 14 mg/dL (ref 8–23)
CO2: 28 mmol/L (ref 22–32)
Calcium: 9.1 mg/dL (ref 8.9–10.3)
Chloride: 102 mmol/L (ref 98–111)
Creatinine, Ser: 0.79 mg/dL (ref 0.44–1.00)
GFR, Estimated: >60 mL/min (ref >60)
Glucose, Bld: 104 mg/dL — ABNORMAL HIGH (ref 70–99)
Potassium: 4 mmol/L (ref 3.5–5.1)
Sodium: 139 mmol/L (ref 135–145)

## 2022-07-05 LAB — CBC
HCT: 44.6 % (ref 36.0–46.0)
Hemoglobin: 14.2 g/dL (ref 12.0–15.0)
MCH: 29.3 pg (ref 26.0–34.0)
MCHC: 31.8 g/dL (ref 30.0–36.0)
MCV: 92 fL (ref 80.0–100.0)
Platelets: 244 10*3/uL (ref 150–400)
RBC: 4.85 MIL/uL (ref 3.87–5.11)
RDW: 13.2 % (ref 11.5–15.5)
WBC: 6.9 10*3/uL (ref 4.0–10.5)
nRBC: 0 % (ref 0.0–0.2)

## 2022-07-05 NOTE — ED Notes (Signed)
Patient daughter in law states the patient got lightheaded while cleaning bathroom yesterday as well as has been having issues with dementia as well.

## 2022-07-05 NOTE — ED Triage Notes (Signed)
Patient complains of chest tightness x 2 hours. Patient sent from [facility] for further evaluation. Now reports sob and the chest pressure is minimal. Alert and oriented, NAD

## 2022-07-05 NOTE — ED Provider Triage Note (Signed)
Emergency Medicine Provider Triage Evaluation Note  [name] , a 76 y.o. female  was evaluated in triage.  Pt complains of shortness of breath.  Onset of shortness of breath while walking to church today.  She has persistent shortness of breath and pressure in her sternum.  Denies chest pain nausea or vomiting.  History of high cholesterol takes medications.  Review of Systems  Positive: Chest discomfort Negative: Chest pain  Physical Exam  BP (!) 171/80 (BP Location: Right Arm)   Pulse 72   Temp 97.7 F (36.5 C)   Resp 15   SpO2 97%  Gen:   Awake, no distress   Resp:  Normal effort  MSK:   Moves extremities without difficulty  Other:  Shortness of breath  Medical Decision Making  Medically screening exam initiated at 2:19 PM.  Appropriate orders placed.  Skyler Dusing was informed that the remainder of the evaluation will be completed by another provider, this initial triage assessment does not replace that evaluation, and the importance of remaining in the ED until their evaluation is complete.  Work-up initially   Margarita Mail, PA-C 07/05/22 1420

## 2022-07-06 ENCOUNTER — Encounter (HOSPITAL_COMMUNITY): Payer: Self-pay | Admitting: Emergency Medicine

## 2022-07-06 DIAGNOSIS — R079 Chest pain, unspecified: Secondary | ICD-10-CM | POA: Diagnosis not present

## 2022-07-06 DIAGNOSIS — E785 Hyperlipidemia, unspecified: Secondary | ICD-10-CM | POA: Diagnosis not present

## 2022-07-06 DIAGNOSIS — G3184 Mild cognitive impairment, so stated: Secondary | ICD-10-CM | POA: Diagnosis not present

## 2022-07-06 MED ORDER — ASPIRIN 325 MG PO TABS
325.0000 mg | ORAL_TABLET | Freq: Once | ORAL | Status: DC
  Filled 2022-07-06: qty 1

## 2022-07-06 MED ORDER — ACETAMINOPHEN 325 MG PO TABS: 650.0000 mg | ORAL_TABLET | ORAL | Status: DC | PRN

## 2022-07-06 MED ORDER — ENOXAPARIN SODIUM 40 MG/0.4ML IJ SOSY: 40.0000 mg | PREFILLED_SYRINGE | INTRAMUSCULAR | Status: DC

## 2022-07-06 MED ORDER — ONDANSETRON HCL 4 MG/2ML IJ SOLN: 4.0000 mg | Freq: Four times a day (QID) | INTRAMUSCULAR | Status: DC | PRN

## 2022-07-06 NOTE — ED Provider Notes (Signed)
[facility] EMERGENCY DEPARTMENT Provider Note   CSN: [id number] Arrival date & time: 07/05/22  1406     History  No chief complaint on file.   [name] is a 76 y.o. female.  76 year old female with past medical history of hyperlipidemia not currently on statin presents today for evaluation of chest pressure associated with shortness of breath, nausea.  Chest pain did not radiate anywhere.  No prior history of CAD.  Had lightheadedness initially which has resolved.  Denies abdominal pain.  She was evaluated at urgent care and due to concern for abnormal EKG was referred to emergency room for evaluation.  Of note she also states while she was cleaning in the bathroom yesterday she fell hitting her chest wall on the tank of the commode.  She did not have chest pain following this fall until she presented to the emergency room.  Now reports pain with palpation and deep breathing.  The history is provided by the patient. No language interpreter was used.       Home Medications Prior to Admission medications   Medication Sig Start Date End Date Taking? Authorizing Provider  diclofenac (VOLTAREN) 75 MG EC tablet Take 75 mg by mouth as needed.    [provider]  PARoxetine (PAXIL) 20 MG tablet  09/04/16   [provider]  simvastatin (ZOCOR) 40 MG tablet  08/17/16   [provider]      Allergies    Codeine    Review of Systems   Review of Systems  Constitutional:  Negative for chills and fever.  Eyes:  Negative for visual disturbance.  Respiratory:  Positive for shortness of breath.   Cardiovascular:  Positive for chest pain. Negative for palpitations and leg swelling.  Gastrointestinal:  Positive for nausea. Negative for abdominal pain and vomiting.  Neurological:  Positive for light-headedness. Negative for syncope.  All other systems reviewed and are negative.   Physical Exam Updated Vital Signs BP (!) 146/72   Pulse 62    Temp 98 F (36.7 C) (Oral)   Resp 17   SpO2 99%  Physical Exam Vitals and nursing note reviewed.  Constitutional:      General: She is not in acute distress.    Appearance: Normal appearance. She is not ill-appearing.  HENT:     Head: Normocephalic and atraumatic.     Nose: Nose normal.  Eyes:     General: No scleral icterus.    Extraocular Movements: Extraocular movements intact.     Conjunctiva/sclera: Conjunctivae normal.  Cardiovascular:     Rate and Rhythm: Normal rate and regular rhythm.     Pulses: Normal pulses.     Comments: Central chest wall tenderness to palpation present. Pulmonary:     Effort: Pulmonary effort is normal. No respiratory distress.     Breath sounds: Normal breath sounds. No wheezing or rales.  Abdominal:     General: There is no distension.     Tenderness: There is no abdominal tenderness.  Musculoskeletal:        General: Normal range of motion.     Cervical back: Normal range of motion.     Right lower leg: No edema.     Left lower leg: No edema.  Skin:    General: Skin is warm and dry.  Neurological:     General: No focal deficit present.     Mental Status: She is alert. Mental status is at baseline.     ED Results /  Procedures / Treatments   Labs (all labs ordered are listed, but only abnormal results are displayed) Labs Reviewed  BASIC METABOLIC PANEL - Abnormal; Notable for the following components:      Result Value   Glucose, Bld 104 (*)    All other components within normal limits  CBC  LIPID PANEL  TROPONIN I (HIGH SENSITIVITY)  TROPONIN I (HIGH SENSITIVITY)    EKG None  Radiology DG Chest 2 View  Result Date: 07/05/2022 CLINICAL DATA:  Shortness of breath.  Chest pain. EXAM: CHEST - 2 VIEW COMPARISON:  02/01/2018 FINDINGS: Lungs are hyperexpanded. The lungs are clear without focal pneumonia, edema, pneumothorax or pleural effusion. The cardiopericardial silhouette is within normal limits for size. Bones are diffusely  demineralized. IMPRESSION: No active cardiopulmonary disease. Electronically Signed   By: Misty Stanley M.D.   On: 07/05/2022 14:46    Procedures Procedures    Medications Ordered in ED Medications  acetaminophen (TYLENOL) tablet 650 mg (has no administration in time range)  ondansetron (ZOFRAN) injection 4 mg (has no administration in time range)  enoxaparin (LOVENOX) injection 40 mg (has no administration in time range)  aspirin tablet 325 mg (has no administration in time range)    ED Course/ Medical Decision Making/ A&P                           Medical Decision Making Risk Decision regarding hospitalization.   Medical Decision Making / ED Course   This patient presents to the ED for concern of chest pain, this involves an extensive number of treatment options, and is a complaint that carries with it a high risk of complications and morbidity.  The differential diagnosis includes ACS, PE, pneumonia, MSK pain  MDM: 76 year old female presents today for evaluation of chest pain.  Onset she walked into discharge.  Associated with shortness of breath, lightheadedness, nausea.  No vomiting.  Not compliant with her statin.  Work-up overall reassuring.  CBC unremarkable.  BMP unremarkable with exception of glucose of 104.  Troponin negative x2.  Chest x-ray without acute cardiopulmonary process.  EKG without acute ischemic changes with the exception of T wave inversions in leads V1 and V2.  Chest pain with typical features.  High risk chest pain given age, typical features, and risk factors.  Chest wall tenderness present.  Likely related to fall that occurred yesterday.  Discussion had regarding admission which family is amenable to.  Discussed with hospitalist who will evaluate patient for admission.   Lab Tests: -I ordered, reviewed, and interpreted labs.   The pertinent results include:   Labs Reviewed  BASIC METABOLIC PANEL - Abnormal; Notable for the following components:       Result Value   Glucose, Bld 104 (*)    All other components within normal limits  CBC  LIPID PANEL  TROPONIN I (HIGH SENSITIVITY)  TROPONIN I (HIGH SENSITIVITY)      EKG  EKG Interpretation  Date/Time:    Ventricular Rate:    PR Interval:    QRS Duration:   QT Interval:    QTC Calculation:   R Axis:     Text Interpretation:           Imaging Studies ordered: I ordered imaging studies including CXR I independently visualized and interpreted imaging. I agree with the radiologist interpretation   Medicines ordered and prescription drug management: Meds ordered this encounter  Medications   acetaminophen (TYLENOL) tablet 650 mg  ondansetron (ZOFRAN) injection 4 mg   enoxaparin (LOVENOX) injection 40 mg   aspirin tablet 325 mg    -I have reviewed the patients home medicines and have made adjustments as needed  Cardiac Monitoring: The patient was maintained on a cardiac monitor.  I personally viewed and interpreted the cardiac monitored which showed an underlying rhythm of: NSR  Co morbidities that complicate the patient evaluation  Past Medical History:  Diagnosis Date   GERD (gastroesophageal reflux disease)    High cholesterol    OSA (obstructive sleep apnea)       Dispostion: Patient was evaluated by hospitalist for admission.  Patient between my evaluation and the hospitalist evaluation decided she would like to leave and not be admitted.  Discussion had regarding her elevated risk, typical features for cardiac chest pain.  She understands this and will still prefer to be discharged.  We will provide her with outpatient cardiology appointment.  Patient was evaluated by hospitalist.  Discussed that our recommendation is admission and inpatient work-up.  Discussed if she leaves it would be AMA.  She voices understanding.  Daughter-in-law and husband are at bedside.  They are also in agreement.   Final Clinical Impression(s) / ED Diagnoses Final diagnoses:   Precordial chest pain    Rx / DC Orders ED Discharge Orders          Ordered    Ambulatory referral to Cardiology       Comments: If you have not heard from the Cardiology office within the next 72 hours please call [phone].   07/06/22 0259              Evlyn Courier, PA-C 07/06/22 0301    Mesner, Corene Cornea, MD 07/06/22 [id number]

## 2022-07-06 NOTE — H&P (Addendum)
History and Physical    Patient: [name] XBD:[id number] DOB: May 19, 1946 DOA: 07/05/2022 DOS: the patient was seen and examined on 07/06/2022 PCP: Shirline Frees, MD  Patient coming from: Home  Chief Complaint: Chest pain  HPI: [name] is a 76 y.o. female with medical history significant of HLD, OSA, GERD.  Pt used to be on statin but no longer takes.  Pt presents to ED with c/o CP x2 hours.  Located in L upper chest.  Associated with SOB.  Pain also worse with deep breath.  Pt has chest wall tenderness to palpation.  Pain minimal on arrival to ED, pain now resolved.  Pt also had episode of lightheadedness yesterday as well as fall in bathroom.  Didn't have CP until today however.   Review of Systems: As mentioned in the history of present illness. All other systems reviewed and are negative. Past Medical History:  Diagnosis Date   GERD (gastroesophageal reflux disease)    High cholesterol    OSA (obstructive sleep apnea)    History reviewed. No pertinent surgical history. Social History:  reports that she has never smoked. She has never used smokeless tobacco. No history on file for alcohol use and drug use.  Allergies  Allergen Reactions   Codeine Itching    History reviewed. No pertinent family history.  Prior to Admission medications   Medication Sig Start Date End Date Taking? Authorizing Provider  diclofenac (VOLTAREN) 75 MG EC tablet Take 75 mg by mouth as needed.    [provider]  PARoxetine (PAXIL) 20 MG tablet  09/04/16   [provider]  simvastatin (ZOCOR) 40 MG tablet  08/17/16   [provider]    Physical Exam: Vitals:   07/05/22 2026 07/05/22 2353 07/06/22 0100 07/06/22 0130  BP: 140/75 140/72 (!) 143/67 (!) 146/72  Pulse: 73 70 64 62  Resp: 16 18 (!) 21 17  Temp: 98 F (36.7 C)     TempSrc: Oral     SpO2: 96% 99% 99% 99%   Constitutional: NAD, calm, comfortable Eyes: PERRL, lids and conjunctivae normal ENMT:  Mucous membranes are moist. Posterior pharynx clear of any exudate or lesions.Normal dentition.  Neck: normal, supple, no masses, no thyromegaly Respiratory: clear to auscultation bilaterally, no wheezing, no crackles. Normal respiratory effort. No accessory muscle use.  Cardiovascular: Regular rate and rhythm, no murmurs / rubs / gallops. No extremity edema. 2+ pedal pulses. No carotid bruits.  Abdomen: no tenderness, no masses palpated. No hepatosplenomegaly. Bowel sounds positive.  Musculoskeletal: no clubbing / cyanosis. No joint deformity upper and lower extremities. Good ROM, no contractures. Normal muscle tone.  Skin: no rashes, lesions, ulcers. No induration Neurologic: CN 2-12 grossly intact. Sensation intact, DTR normal. Strength 5/5 in all 4.  Psychiatric: Normal judgment and insight. Alert and oriented x 3. Normal mood.   Data Reviewed:    Trops neg X2  EKG without ischemic changes.  CBC and BMP not really remarkable  CXR = no acute findings  Assessment and Plan: * Chest pain, rule out acute myocardial infarction HEART score of 4. CP with typical and atypical features. Wondering if she may have just pulled a muscle when she had a fall yesterday. Admit plan = CP obs pathway NPO Tele monitor ASA 325 x1 dose 2d echo (will call the lightheaded episode yesterday a "near syncope") Cards eval in AM  On my discussion with patient and family however, patient would prefer to decline admission and just go home at this  time. This is not unreasonable. Given that she is on the lower side of risk as far as who we would admit and obs anyhow with HEART score of 4 at most. We will say, admission was offered, patient did decline this.  She and family will return if any return of symptoms.  HLD (hyperlipidemia) Previously on statin but stopped statin x2 days ago due to concern for it causing memory issues. Resume statin for now Encouraged patient to discuss her concern (which is a  legitimate concern regarding statins) with her PCP before stopping the med. And also if she is concerned about statins being cause of memory loss, ask PCP if: Can they differentiate or can they refer her to someone who can differentiate statin induced memory loss from other forms of early onset dementia (which statins may actually prevent / slow down progression of, IE vascular dementia and possibly alzheimer's). Is she a candidate for another agent to treat her HLD which may not have the memory issues associated with statins (ie PCSK9 inhibitors).  Rather than just leaving HLD untreated.  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      Advance Care Planning:   Code Status: Full Code  Consults: None  Family Communication: Family at bedside  Severity of Illness: The appropriate patient status for this patient is OBSERVATION. Observation status is judged to be reasonable and necessary in order to provide the required intensity of service to ensure the patient's safety. The patient's presenting symptoms, physical exam findings, and initial radiographic and laboratory data in the context of their medical condition is felt to place them at decreased risk for further clinical deterioration. Furthermore, it is anticipated that the patient will be medically stable for discharge from the hospital within 2 midnights of admission.   Author: Etta Quill., DO 07/06/2022 2:05 AM  For on call review [email or web address].

## 2022-07-06 NOTE — Assessment & Plan Note (Addendum)
Previously on statin but stopped statin x2 days ago due to concern for it causing memory issues. 1. Resume statin for now 2. Encouraged patient to discuss her concern (which is a legitimate concern regarding statins) with her PCP before stopping the med. 3. And also if she is concerned about statins being cause of memory loss, ask PCP if: 1. Can they differentiate or can they refer her to someone who can differentiate statin induced memory loss from other forms of early onset dementia (which statins may actually prevent / slow down progression of, IE vascular dementia and possibly alzheimer's). 2. Is she a candidate for another agent to treat her HLD which may not have the memory issues associated with statins (ie PCSK9 inhibitors).  Rather than just leaving HLD untreated.

## 2022-07-06 NOTE — Assessment & Plan Note (Addendum)
HEART score of 4. CP with typical and atypical features. Wondering if she may have just pulled a muscle when she had a fall yesterday. Admit plan = 1. CP obs pathway 2. NPO 3. Tele monitor 4. ASA 325 x1 dose 5. 2d echo (will call the lightheaded episode yesterday a "near syncope") 6. Cards eval in AM  On my discussion with patient and family however, patient would prefer to decline admission and just go home at this time. This is not unreasonable. Given that she is on the lower side of risk as far as who we would admit and obs anyhow with HEART score of 4 at most. We will say, admission was offered, patient did decline this.  She and family will return if any return of symptoms.

## 2022-07-06 NOTE — ED Notes (Signed)
Patient verbalizes understanding of discharge instructions. Opportunity for questioning and answers were provided. Armband removed by staff, pt discharged from ED. Pt ambulatory to ED waiting room with husband.

## 2022-07-06 NOTE — Assessment & Plan Note (Signed)
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

## 2022-07-06 NOTE — Discharge Instructions (Addendum)
Your blood work, EKG, chest x-ray were overall reassuring.  However you do have cardiac risk factors, and the story of your chest pain was concerning.  We discussed admission into the hospital for further work-up and evaluation by cardiologist.  You decided you did not want to stay for this work-up.  I have given you follow-up with cardiology outpatient.  I recommend you follow-up with your primary care provider.  Also recommend you restart your cholesterol medicine.  If you have any worsening symptoms such as chest pain, shortness of breath please return to the emergency room. You probably also have a pulled muscle due to a fall yesterday.  Recommend taking Tylenol as needed for pain control.  Given the story we discussed that this pain is different from the episode you had this morning while at church.

## 2022-07-09 DIAGNOSIS — E7800 Pure hypercholesterolemia, unspecified: Secondary | ICD-10-CM | POA: Diagnosis not present

## 2022-07-09 DIAGNOSIS — R079 Chest pain, unspecified: Secondary | ICD-10-CM | POA: Diagnosis not present

## 2022-07-23 ENCOUNTER — Encounter: Payer: Self-pay | Admitting: Cardiovascular Disease

## 2022-07-23 ENCOUNTER — Ambulatory Visit: Payer: Medicare HMO | Admitting: Cardiovascular Disease

## 2022-07-23 VITALS — BP 122/70 | HR 79 | Ht 64.0 in | Wt 170.6 lb

## 2022-07-23 DIAGNOSIS — E7800 Pure hypercholesterolemia, unspecified: Secondary | ICD-10-CM | POA: Diagnosis not present

## 2022-07-23 DIAGNOSIS — R072 Precordial pain: Secondary | ICD-10-CM

## 2022-07-23 DIAGNOSIS — G4733 Obstructive sleep apnea (adult) (pediatric): Secondary | ICD-10-CM

## 2022-07-23 MED ORDER — METOPROLOL TARTRATE 100 MG PO TABS: ORAL_TABLET | ORAL | 0 refills | Status: DC

## 2022-07-23 NOTE — Progress Notes (Signed)
Cardiology Office Note:    Date:  07/23/2022   ID:  [name], DOB [birth year], MRN [id number]  PCP:  Shirline Frees, MD   [facility] Cardiologist:  Sanda Klein, MD     Referring MD: Evlyn Courier, PA-C   No chief complaint on file. [name] is a 76 y.o. female who is being seen today for the evaluation of chest pain at the request of Evlyn Courier, [place].   History of Present Illness:    [name] is a 76 y.o. female with a hx of obstructive sleep apnea on CPAP, mixed hyperlipidemia, GERD, recently evaluated in the emergency room on 07/05/2022 for an episode of prolonged retrosternal chest pressure.  Symptoms began after she had walked down 2 flights of steps.  She had not had any previous problems with chest pain either climbing or descending steps at that same location.  She describes the discomfort as retrosternal pressure.  It was associated with a sensation of weakness and lightheadedness as well as mild dyspnea.  It lasted for several hours, but by the time she arrived to the emergency room it was largely resolved.  In the emergency room her ECG did not show acute ischemic changes and cardiac enzymes were normal.  She has not experienced recurrent chest pain since then.  The ECG performed on 07/05/2022 shows a QS pattern in leads V1 and V2, but this is not seen on ECG performed in office today, suggesting that the leads may have been placed high on the chest in the emergency room.  The chest x-ray was normal.  She does not have hypertension or diabetes mellitus.  She takes a statin for hyperlipidemia but her most recent LDL cholesterol was mildly elevated at 111.  She has an excellent HDL at 78.  She has been prescribed statins but is not taking that.  She does not have known CAD or PAD and there is no family history of early onset coronary problems.  She has never smoked.  Past Medical History:  Diagnosis Date   GERD (gastroesophageal reflux disease)     High cholesterol    OSA (obstructive sleep apnea)     No past surgical history on file.  Current Medications: Current Meds  Medication Sig   BIOTIN PO Take 1 capsule by mouth daily in the afternoon.   OVER THE COUNTER MEDICATION Take 1 capsule by mouth daily in the afternoon.   Oyster Shell Calcium 500 MG TABS Take 1 tablet by mouth every other day.   PARoxetine (PAXIL) 20 MG tablet      Allergies:   Codeine   Social History   Socioeconomic History   Marital status: Married    Spouse name: Not on file   Number of children: Not on file   Years of education: Not on file   Highest education level: Not on file  Occupational History   Not on file  Tobacco Use   Smoking status: Never   Smokeless tobacco: Never  Substance and Sexual Activity   Alcohol use: Not on file   Drug use: Not on file   Sexual activity: Not on file  Other Topics Concern   Not on file  Social History Narrative   Not on file   Social Determinants of Health   Financial Resource Strain: Not on file  Food Insecurity: Not on file  Transportation Needs: Not on file  Physical Activity: Not on file  Stress: Not on file  Social Connections: Not on  file     Family History: The patient's family history is significant for the absence of coronary disease at early ages, although she has relatives who had heart disease in their late 78s and 72s.  ROS:   Please see the history of present illness.     All other systems reviewed and are negative.  EKGs/Labs/Other Studies Reviewed:    The following studies were reviewed today: Notes, chest x-ray, ECG, labs from emergency room visit on 07/05/2022  EKG:  EKG is ordered today.  The ekg ordered today demonstrates normal sinus rhythm, normal tracing  Recent Labs: 07/05/2022: BUN 14; Creatinine, Ser 0.79; Hemoglobin 14.2; Platelets 244; Potassium 4.0; Sodium 139  Recent Lipid Panel No results found for: "CHOL", "TRIG", "HDL", "CHOLHDL", "VLDL", "LDLCALC",  "LDLDIRECT" 05/27/2021 Cholesterol 203, HDL 78, LDL 111, triglycerides 83  Risk Assessment/Calculations:           Physical Exam:    VS:  BP 122/70 (BP Location: Left Arm, Patient Position: Sitting, Cuff Size: Normal)   Pulse 79   Ht '5\' 4"'$  (1.626 m)   Wt 170 lb 9.6 oz (77.4 kg)   SpO2 93%   BMI 29.28 kg/m     Wt Readings from Last 3 Encounters:  07/23/22 170 lb 9.6 oz (77.4 kg)  10/08/16 160 lb (72.6 kg)     GEN: Overweight, well nourished, well developed in no acute distress HEENT: Normal NECK: No JVD; No carotid bruits LYMPHATICS: No lymphadenopathy CARDIAC: RRR, no murmurs, rubs, gallops RESPIRATORY:  Clear to auscultation without rales, wheezing or rhonchi  ABDOMEN: Soft, non-tender, non-distended MUSCULOSKELETAL:  No edema; No deformity  SKIN: Warm and dry NEUROLOGIC:  Alert and oriented x 3 PSYCHIATRIC:  Normal affect   ASSESSMENT:    No diagnosis found. PLAN:    In order of problems listed above:  Chest pain: Having both typical and atypical features.  Normal cardiac enzymes despite several hours of symptoms.  Limited coronary risk factors (age and very mild hypercholesterolemia).  We will schedule for coronary CT angiogram. HLP: If coronary CT shows evidence of stenoses or high calcium score we will intensify statin therapy to target LDL less than 70. OSA: Reports compliance with CPAP and denies daytime hypersomnolence.  Weight loss would benefit hER.           Medication Adjustments/Labs and Tests Ordered: Current medicines are reviewed at length with the patient today.  Concerns regarding medicines are outlined above.  No orders of the defined types were placed in this encounter.  No orders of the defined types were placed in this encounter.   Patient Instructions  Medication Instructions:  No changes *If you need a refill on your cardiac medications before your next appointment, please call your pharmacy*   Lab Work: None ordered If you  have labs (blood work) drawn today and your tests are completely normal, you will receive your results only by: [place] (if you have MyChart) OR A paper copy in the mail If you have any lab test that is abnormal or we need to change your treatment, we will call you to review the results.  Follow-Up: At [facility], you and your health needs are our priority.  As part of our continuing mission to provide you with exceptional heart care, we have created designated Provider Care Teams.  These Care Teams include your primary Cardiologist (physician) and Advanced Practice Providers (APPs -  Physician Assistants and Nurse Practitioners) who all work together to provide you with the  care you need, when you need it.  We recommend signing up for the patient portal called "MyChart".  Sign up information is provided on this After Visit Summary.  MyChart is used to connect with patients for Virtual Visits (Telemedicine).  Patients are able to view lab/test results, encounter notes, upcoming appointments, etc.  Non-urgent messages can be sent to your provider as well.   To learn more about what you can do with MyChart, go to [email or web address].    Your next appointment:   3 month(s)  The format for your next appointment:   In Person  Provider:   Dr. Sallyanne Kuster   Your cardiac CT will be scheduled at one of the below locations:   [facility] [address] [place], [place] [address] [phone]  [place] [address] [place], [place] [address] [phone]  If scheduled at [facility], please arrive at the [facility] and Children's Entrance (Entrance C2) of [facility] 30 minutes prior to test start time. You can use the FREE valet parking offered at entrance C (encouraged to control the heart rate for the test)  Proceed to the [facility] Radiology Department (first floor) to check-in and test  prep.  All radiology patients and guests should use entrance C2 at [facility], accessed from [facility], even though the hospital's physical address listed is [address]    If scheduled at [facility], please arrive 15 mins early for check-in and test prep.  Please follow these instructions carefully (unless otherwise directed):  Hold all erectile dysfunction medications at least 3 days (72 hrs) prior to test.  On the Night Before the Test: Be sure to Drink plenty of water. Do not consume any caffeinated/decaffeinated beverages or chocolate 12 hours prior to your test. Do not take any antihistamines 12 hours prior to your test. If the patient has contrast allergy: Patient will need a prescription for Prednisone and very clear instructions (as follows): Prednisone 50 mg - take 13 hours prior to test Take another Prednisone 50 mg 7 hours prior to test Take another Prednisone 50 mg 1 hour prior to test Take Benadryl 50 mg 1 hour prior to test Patient must complete all four doses of above prophylactic medications. Patient will need a ride after test due to Benadryl.  On the Day of the Test: Drink plenty of water until 1 hour prior to the test. Do not eat any food 4 hours prior to the test. You may take your regular medications prior to the test.  Take metoprolol (Lopressor) two hours prior to test. HOLD Furosemide/Hydrochlorothiazide morning of the test. FEMALES- please wear underwire-free bra if available, avoid dresses & tight clothing   *For Clinical Staff only. Please instruct patient the following:* Heart Rate Medication Recommendations for Cardiac CT  Resting HR < 50 bpm  No medication  Resting HR 50-60 bpm and BP >110/50 mmHG   Consider Metoprolol tartrate 25 mg PO 90-120 min prior to scan  Resting HR 60-65 bpm and BP >110/50 mmHG  Metoprolol tartrate 50 mg PO 90-120 minutes prior to scan   Resting HR > 65 bpm  and BP >110/50 mmHG  Metoprolol tartrate 100 mg PO 90-120 minutes prior to scan  Consider Ivabradine 10-15 mg PO or a calcium channel blocker for resting HR >60 bpm and contraindication to metoprolol tartrate  Consider Ivabradine 10-15 mg PO in combination with metoprolol tartrate for  HR >80 bpm         After the Test: Drink plenty of water. After receiving IV contrast, you may experience a mild flushed feeling. This is normal. On occasion, you may experience a mild rash up to 24 hours after the test. This is not dangerous. If this occurs, you can take Benadryl 25 mg and increase your fluid intake. If you experience trouble breathing, this can be serious. If it is severe call 911 IMMEDIATELY. If it is mild, please call our office. If you take any of these medications: Glipizide/Metformin, Avandament, Glucavance, please do not take 48 hours after completing test unless otherwise instructed.  We will call to schedule your test 2-4 weeks out understanding that some insurance companies will need an authorization prior to the service being performed.   For non-scheduling related questions, please contact the cardiac imaging nurse navigator should you have any questions/concerns: Marchia Bond, Cardiac Imaging Nurse Navigator Gordy Clement, Cardiac Imaging Nurse Navigator [place] [facility] Direct Office Dial: [phone]   For scheduling needs, including cancellations and rescheduling, please call [place], [phone].  Other Instructions   Important Information About Sugar         Signed, Sanda Klein, MD  07/23/2022 11:30 AM    [place]

## 2022-07-23 NOTE — Patient Instructions (Signed)
Medication Instructions:  No changes *If you need a refill on your cardiac medications before your next appointment, please call your pharmacy*   Lab Work: None ordered If you have labs (blood work) drawn today and your tests are completely normal, you will receive your results only by: [place] (if you have MyChart) OR A paper copy in the mail If you have any lab test that is abnormal or we need to change your treatment, we will call you to review the results.  Follow-Up: At [facility], you and your health needs are our priority.  As part of our continuing mission to provide you with exceptional heart care, we have created designated Provider Care Teams.  These Care Teams include your primary Cardiologist (physician) and Advanced Practice Providers (APPs -  Physician Assistants and Nurse Practitioners) who all work together to provide you with the care you need, when you need it.  We recommend signing up for the patient portal called "MyChart".  Sign up information is provided on this After Visit Summary.  MyChart is used to connect with patients for Virtual Visits (Telemedicine).  Patients are able to view lab/test results, encounter notes, upcoming appointments, etc.  Non-urgent messages can be sent to your provider as well.   To learn more about what you can do with MyChart, go to [email or web address].    Your next appointment:   3 month(s)  The format for your next appointment:   In Person  Provider:   Dr. Sallyanne Kuster   Your cardiac CT will be scheduled at one of the below locations:   [facility] [address] [place], [place] [address] [phone]  [place] [address] [place],  [address] [phone]  If scheduled at [facility], please arrive at the [facility] and Children's Entrance (Entrance C2) of [facility] 30 minutes prior to test start time. You can use the  FREE valet parking offered at entrance C (encouraged to control the heart rate for the test)  Proceed to the [facility] Radiology Department (first floor) to check-in and test prep.  All radiology patients and guests should use entrance C2 at [facility], accessed from [facility], even though the hospital's physical address listed is [address]    If scheduled at [facility], please arrive 15 mins early for check-in and test prep.  Please follow these instructions carefully (unless otherwise directed):   On the Night Before the Test: Be sure to Drink plenty of water. Do not consume any caffeinated/decaffeinated beverages or chocolate 12 hours prior to your test. Do not take any antihistamines 12 hours prior to your test.   On the Day of the Test: Drink plenty of water until 1 hour prior to the test. Do not eat any food 4 hours prior to the test. You may take your regular medications prior to the test.  Take metoprolol (Lopressor) two hours prior to test. FEMALES- please wear underwire-free bra if available, avoid dresses & tight clothing       After the Test: Drink plenty of water. After receiving IV contrast, you may experience a mild flushed feeling. This is normal. On occasion, you may experience a mild rash up to 24 hours after the test. This is not dangerous. If this occurs, you can take Benadryl 25 mg and increase your fluid intake. If you experience trouble breathing, this can be serious. If it is  severe call 911 IMMEDIATELY. If it is mild, please call our office. If you take any of these medications: Glipizide/Metformin, Avandament, Glucavance, please do not take 48 hours after completing test unless otherwise instructed.  We will call to schedule your test 2-4 weeks out understanding that some insurance companies will need an authorization prior to the service being performed.   For non-scheduling related  questions, please contact the cardiac imaging nurse navigator should you have any questions/concerns: Marchia Bond, Cardiac Imaging Nurse Navigator Gordy Clement, Cardiac Imaging Nurse Navigator [place] [facility] Direct Office Dial: [phone]   For scheduling needs, including cancellations and rescheduling, please call [place], [phone].

## 2022-07-24 ENCOUNTER — Encounter: Payer: Self-pay | Admitting: Cardiovascular Disease

## 2022-08-10 ENCOUNTER — Telehealth (HOSPITAL_COMMUNITY): Payer: Self-pay | Admitting: *Deleted

## 2022-08-10 ENCOUNTER — Encounter (HOSPITAL_COMMUNITY): Payer: Self-pay

## 2022-08-10 ENCOUNTER — Other Ambulatory Visit (HOSPITAL_COMMUNITY): Payer: Self-pay | Admitting: *Deleted

## 2022-08-10 MED ORDER — METOPROLOL TARTRATE 100 MG PO TABS: ORAL_TABLET | ORAL | 0 refills | Status: DC

## 2022-08-10 NOTE — Telephone Encounter (Signed)
Reaching out to patient to offer assistance regarding upcoming cardiac imaging study; pt verbalizes understanding of appt date/time, parking situation and where to check in, pre-test NPO status and medications ordered, and verified current allergies; name and call back number provided for further questions should they arise  Gordy Clement RN Navigator Cardiac Imaging [facility] [phone] office [phone] cell  Patient to take '100mg'$  metoprolol tartrate two hours prior to his cardiac CT scan. She is aware to arrive at 9am.

## 2022-08-12 ENCOUNTER — Ambulatory Visit (HOSPITAL_COMMUNITY)
Admission: RE | Admit: 2022-08-12 | Discharge: 2022-08-12 | Disposition: A | Discharge status: Home / Self Care | Payer: Medicare HMO | Source: Ambulatory Visit | Attending: Internal Medicine | Admitting: Internal Medicine

## 2022-08-12 ENCOUNTER — Ambulatory Visit (HOSPITAL_COMMUNITY)
Admission: RE | Admit: 2022-08-12 | Discharge: 2022-08-12 | Disposition: A | Discharge status: Home / Self Care | Payer: Medicare HMO | Source: Ambulatory Visit | Attending: Cardiovascular Disease | Admitting: Cardiovascular Disease

## 2022-08-12 ENCOUNTER — Ambulatory Visit (HOSPITAL_BASED_OUTPATIENT_CLINIC_OR_DEPARTMENT_OTHER)
Admission: RE | Admit: 2022-08-12 | Discharge: 2022-08-12 | Disposition: A | Discharge status: Home / Self Care | Payer: Medicare HMO | Source: Ambulatory Visit | Attending: Internal Medicine | Admitting: Internal Medicine

## 2022-08-12 VITALS — BP 116/63 | HR 60

## 2022-08-12 DIAGNOSIS — R072 Precordial pain: Secondary | ICD-10-CM | POA: Diagnosis not present

## 2022-08-12 DIAGNOSIS — R931 Abnormal findings on diagnostic imaging of heart and coronary circulation: Secondary | ICD-10-CM

## 2022-08-12 DIAGNOSIS — R079 Chest pain, unspecified: Secondary | ICD-10-CM | POA: Insufficient documentation

## 2022-08-12 DIAGNOSIS — I2510 Atherosclerotic heart disease of native coronary artery without angina pectoris: Secondary | ICD-10-CM | POA: Insufficient documentation

## 2022-08-12 DIAGNOSIS — I700 Atherosclerosis of aorta: Secondary | ICD-10-CM | POA: Diagnosis not present

## 2022-08-12 MED ORDER — NITROGLYCERIN 0.4 MG SL SUBL
0.8000 mg | SUBLINGUAL_TABLET | Freq: Once | SUBLINGUAL | Status: AC
  Administered 2022-08-12: 0.8 mg via SUBLINGUAL

## 2022-08-12 MED ORDER — IOHEXOL 350 MG/ML SOLN
100.0000 mL | Freq: Once | INTRAVENOUS | Status: AC | PRN
  Administered 2022-08-12: 100 mL via INTRAVENOUS

## 2022-08-12 MED ORDER — NITROGLYCERIN 0.4 MG SL SUBL
SUBLINGUAL_TABLET | SUBLINGUAL | Status: AC
  Filled 2022-08-12: qty 2

## 2022-08-17 ENCOUNTER — Other Ambulatory Visit: Payer: Self-pay | Admitting: *Deleted

## 2022-08-17 MED ORDER — ROSUVASTATIN CALCIUM 20 MG PO TABS: 20.0000 mg | ORAL_TABLET | Freq: Every day | ORAL | 3 refills | Status: DC

## 2022-08-25 DIAGNOSIS — Z01419 Encounter for gynecological examination (general) (routine) without abnormal findings: Secondary | ICD-10-CM | POA: Diagnosis not present

## 2022-08-25 DIAGNOSIS — Z6830 Body mass index (BMI) 30.0-30.9, adult: Secondary | ICD-10-CM | POA: Diagnosis not present

## 2022-09-30 ENCOUNTER — Encounter: Payer: Self-pay | Admitting: Cardiovascular Disease

## 2022-09-30 ENCOUNTER — Ambulatory Visit: Payer: Medicare HMO | Attending: Cardiovascular Disease | Admitting: Cardiovascular Disease

## 2022-09-30 VITALS — BP 111/65 | HR 85 | Ht 64.0 in

## 2022-09-30 DIAGNOSIS — G4733 Obstructive sleep apnea (adult) (pediatric): Secondary | ICD-10-CM | POA: Diagnosis not present

## 2022-09-30 DIAGNOSIS — I2510 Atherosclerotic heart disease of native coronary artery without angina pectoris: Secondary | ICD-10-CM

## 2022-09-30 DIAGNOSIS — E7800 Pure hypercholesterolemia, unspecified: Secondary | ICD-10-CM

## 2022-09-30 NOTE — Progress Notes (Signed)
Cardiology Office Note:    Date:  10/04/2022   ID:  [name], DOB 11-25-1946, MRN [id number]  PCP:  Shirline Frees, MD   [place] Prov[facility] Cardiologist:  Sanda Klein, MD     Referring MD: Shirline Frees, MD   Chief Complaint  Patient presents with   Follow-up  [name] is a 76 y.o. female who is being seen today for the evaluation of chest pain at the request of Shirline Frees, MD.   History of Present Illness:    [name] is a 76 y.o. female with a hx of obstructive sleep apnea on CPAP, mixed hyperlipidemia, GERD, recently evaluated in the emergency room on 07/05/2022 for an episode of prolonged retrosternal chest pressure, beginning after she had walked down 2 flights of steps.  Work-up in the emergency room was benign and she returns in follow-up after undergoing coronary CTA.  The coronary CT does show calcium score that is well above average at 34 percentile for age and gender (total 84).  Moderate stenoses were seen in the distal left main (25-49%), proximal LAD (50-70%), mild stenosis elsewhere.  Aortic atherosclerosis was seen but there was no evidence of aortic aneurysm.  CT FFR confirmed that the stenosis seen were not hemodynamically significant.  She is feeling well.  She is going to be [employer] and exercises 2-3 times a week.  She has not had any new episodes of chest pain or shortness of breath.  She does not have hypertension or diabetes mellitus.  She takes a statin for hyperlipidemia but her most recent LDL cholesterol was mildly elevated at 111.  She has an excellent HDL at 78.  She has been prescribed statins but is not taking that.  She does not have known CAD or PAD and there is no family history of early onset coronary problems.  She has never smoked.  Past Medical History:  Diagnosis Date   GERD (gastroesophageal reflux disease)    High cholesterol    OSA (obstructive sleep apnea)     History reviewed. No pertinent surgical  history.  Current Medications: Current Meds  Medication Sig   Ascorbic Acid (VITAMIN C PO) Take 1 tablet by mouth daily in the afternoon.   BIOTIN PO Take 1 capsule by mouth daily in the afternoon.   CHOLECALCIFEROL PO Take 1 tablet by mouth daily in the afternoon.   Cyanocobalamin (VITAMIN B 12 PO) Take 1 tablet by mouth daily in the afternoon.   diclofenac (VOLTAREN) 75 MG EC tablet Take 75 mg by mouth as needed.   OVER THE COUNTER MEDICATION Take 1 capsule by mouth daily in the afternoon.   PARoxetine (PAXIL) 20 MG tablet Take 20 mg by mouth daily.   rosuvastatin (CRESTOR) 20 MG tablet Take 1 tablet (20 mg total) by mouth daily.   VITAMIN E PO Take 1 tablet by mouth daily in the afternoon.     Allergies:   Codeine   Social History   Socioeconomic History   Marital status: Married    Spouse name: Not on file   Number of children: Not on file   Years of education: Not on file   Highest education level: Not on file  Occupational History   Not on file  Tobacco Use   Smoking status: Never   Smokeless tobacco: Never  Substance and Sexual Activity   Alcohol use: Not on file   Drug use: Not on file   Sexual activity: Not on file  Other Topics Concern  Not on file  Social History Narrative   Not on file   Social Determinants of Health   Financial Resource Strain: Not on file  Food Insecurity: Not on file  Transportation Needs: Not on file  Physical Activity: Not on file  Stress: Not on file  Social Connections: Not on file     Family History: The patient's family history is significant for the absence of coronary disease at early ages, although she has relatives who had heart disease in their late 27s and 20s.  ROS:   Please see the history of present illness.     All other systems reviewed and are negative.  EKGs/Labs/Other Studies Reviewed:    The following studies were reviewed today: Notes, chest x-ray, ECG, labs from emergency room visit on  07/05/2022  Coronary CT angiography 08/12/2022  RCA is a large dominant artery that gives rise to PDA and PLA. There is no significant plaque.   Left main is a large artery that gives rise to LAD and LCX arteries. Minimal non-obstructive calcified (1-24%) plaque in the body of the left main, mild non-obstructive calcified (25-49%) plaque in the distal left main.   LAD is a large vessel with several small diagonal vessels. Mild non obstructive calcified plaque in the proximal LAD followed by a moderate calcified plaque (50-70%) stenosis distally in the proximal LAD. Mild non obstructive calcified plaque in the mid LAD.   LCX is a non-dominant artery that gives rise to one large OM1 branch. There is an ostial mild calcified plaque at the take off of the large OM1. Minimal non obstructive (1-24% soft plaque in the body of the LCX distal to stair step artifact.  1. Coronary calcium score of 561. This was 86th percentile for age, sex, and race matched control.   2. Normal coronary origin with right dominance.   3. Aortic atherosclerosis.   4. CAD-RADS 3. Moderate stenosis. Consider symptom-guided anti-ischemic pharmacotherapy as well as risk factor modification per guideline directed care. Additional analysis with CT FFR will be submitted.  1. Left Main: No significant functional stenosis, CT-FFR 0.99 at distal lesion.   2. LAD: No significant functional stenosis, CT-FFR 0.96 at pLAD lesions, CT-FFR 0.90 mLAD lesion. 3. LCX: No significant functional stenosis, CT-FFR 0.96 ostial OM1. 4. RCA: No significant functional stenosis, CT-FFR 0.92 distal vessel. 5. Unable to model ramus intermedius (small vessel).   IMPRESSION: 1. CT FFR analysis shows no evidence of significant functional stenosis.  EKG:  EKG is not ordered today.  The ekg ordered at her appointment in July demonstrates normal sinus rhythm, normal tracing  Recent Labs: 07/05/2022: BUN 14; Creatinine, Ser 0.79;  Hemoglobin 14.2; Platelets 244; Potassium 4.0; Sodium 139  Recent Lipid Panel No results found for: "CHOL", "TRIG", "HDL", "CHOLHDL", "VLDL", "LDLCALC", "LDLDIRECT" 05/27/2021 Cholesterol 203, HDL 78, LDL 111, triglycerides 83  Risk Assessment/Calculations:           Physical Exam:    VS:  BP 111/65 (BP Location: Left Arm, Patient Position: Sitting, Cuff Size: Normal)   Pulse 85   Ht '5\' 4"'$  (1.626 m)   SpO2 93%   BMI 29.28 kg/m     Wt Readings from Last 3 Encounters:  07/23/22 170 lb 9.6 oz (77.4 kg)  10/08/16 160 lb (72.6 kg)      General: Alert, oriented x3, no distress, overweight Head: no evidence of trauma, PERRL, EOMI, no exophtalmos or lid lag, no myxedema, no xanthelasma; normal ears, nose and oropharynx Neck: normal jugular venous  pulsations and no hepatojugular reflux; brisk carotid pulses without delay and no carotid bruits Chest: clear to auscultation, no signs of consolidation by percussion or palpation, normal fremitus, symmetrical and full respiratory excursions Cardiovascular: normal position and quality of the apical impulse, regular rhythm, normal first and second heart sounds, no murmurs, rubs or gallops Abdomen: no tenderness or distention, no masses by palpation, no abnormal pulsatility or arterial bruits, normal bowel sounds, no hepatosplenomegaly Extremities: no clubbing, cyanosis or edema; 2+ radial, ulnar and brachial pulses bilaterally; 2+ right femoral, posterior tibial and dorsalis pedis pulses; 2+ left femoral, posterior tibial and dorsalis pedis pulses; no subclavian or femoral bruits Neurological: grossly nonfocal Psych: Normal mood and affect   ASSESSMENT:    1. Coronary artery disease involving native coronary artery of native heart without angina pectoris   2. Hypercholesterolemia   3. OSA (obstructive sleep apnea)    PLAN:    In order of problems listed above:  CAD: Nonobstructive by CT angiography.  However she does have substantial  burden of plaque, placing her in the 8 percentile for age and gender.  The focus will be on risk factor modification in particular bringing the LDL cholesterol down to less than 70.  Started on history of HLP: Started on rosuvastatin 20 mg daily.  Recheck lipid profile in about 3 months OSA: Reports compliance with CPAP and denies daytime hypersomnolence.  Weight loss would be of benefit           Medication Adjustments/Labs and Tests Ordered: Current medicines are reviewed at length with the patient today.  Concerns regarding medicines are outlined above.  Orders Placed This Encounter  Procedures   Lipid panel   No orders of the defined types were placed in this encounter.   Patient Instructions  Medication Instructions:  No changes *If you need a refill on your cardiac medications before your next appointment, please call your pharmacy*   Lab Work: None ordered If you have labs (blood work) drawn today and your tests are completely normal, you will receive your results only by: [place] (if you have MyChart) OR A paper copy in the mail If you have any lab test that is abnormal or we need to change your treatment, we will call you to review the results.  Follow-Up: At [facility], you and your health needs are our priority.  As part of our continuing mission to provide you with exceptional heart care, we have created designated Provider Care Teams.  These Care Teams include your primary Cardiologist (physician) and Advanced Practice Providers (APPs -  Physician Assistants and Nurse Practitioners) who all work together to provide you with the care you need, when you need it.  We recommend signing up for the patient portal called "MyChart".  Sign up information is provided on this After Visit Summary.  MyChart is used to connect with patients for Virtual Visits (Telemedicine).  Patients are able to view lab/test results, encounter notes, upcoming appointments, etc.   Non-urgent messages can be sent to your provider as well.   To learn more about what you can do with MyChart, go to [email or web address].    Your next appointment:   3 month(s)  The format for your next appointment:   In Person  Provider:   Dr. Sallyanne Kuster   Your cardiac CT will be scheduled at one of the below locations:   [facility] [address] [place], [place] [address] [phone]  [place], [place] [address] [phone]  If scheduled at [facility], please arrive at the [facility] and Children's Entrance (Entrance C2) of [facility] 30 minutes prior to test start time. You can use the FREE valet parking offered at entrance C (encouraged to control the heart rate for the test)  Proceed to the [facility] Radiology Department (first floor) to check-in and test prep.  All radiology patients and guests should use entrance C2 at [facility], accessed from [facility], even though the hospital's physical address listed is [address].    If scheduled at [facility], please arrive 15 mins early for check-in and test prep.  Please follow these instructions carefully (unless otherwise directed):  Hold all erectile dysfunction medications at least 3 days (72 hrs) prior to test.  On the Night Before the Test: Be sure to Drink plenty of water. Do not consume any caffeinated/decaffeinated beverages or chocolate 12 hours prior to your test. Do not take any antihistamines 12 hours prior to your test. If the patient has contrast allergy: Patient will need a prescription for Prednisone and very clear instructions (as follows): Prednisone 50 mg - take 13 hours prior to test Take another Prednisone 50 mg 7 hours prior to test Take another Prednisone 50 mg 1 hour prior to test Take Benadryl 50 mg 1 hour prior to  test Patient must complete all four doses of above prophylactic medications. Patient will need a ride after test due to Benadryl.  On the Day of the Test: Drink plenty of water until 1 hour prior to the test. Do not eat any food 4 hours prior to the test. You may take your regular medications prior to the test.  Take metoprolol (Lopressor) two hours prior to test. HOLD Furosemide/Hydrochlorothiazide morning of the test. FEMALES- please wear underwire-free bra if available, avoid dresses & tight clothing   *For Clinical Staff only. Please instruct patient the following:* Heart Rate Medication Recommendations for Cardiac CT  Resting HR < 50 bpm  No medication  Resting HR 50-60 bpm and BP >110/50 mmHG   Consider Metoprolol tartrate 25 mg PO 90-120 min prior to scan  Resting HR 60-65 bpm and BP >110/50 mmHG  Metoprolol tartrate 50 mg PO 90-120 minutes prior to scan   Resting HR > 65 bpm and BP >110/50 mmHG  Metoprolol tartrate 100 mg PO 90-120 minutes prior to scan  Consider Ivabradine 10-15 mg PO or a calcium channel blocker for resting HR >60 bpm and contraindication to metoprolol tartrate  Consider Ivabradine 10-15 mg PO in combination with metoprolol tartrate for HR >80 bpm         After the Test: Drink plenty of water. After receiving IV contrast, you may experience a mild flushed feeling. This is normal. On occasion, you may experience a mild rash up to 24 hours after the test. This is not dangerous. If this occurs, you can take Benadryl 25 mg and increase your fluid intake. If you experience trouble breathing, this can be serious. If it is severe call 911 IMMEDIATELY. If it is mild, please call our office. If you take any of these medications: Glipizide/Metformin, Avandament, Glucavance, please do not take 48 hours after completing test unless otherwise instructed.  We will call to schedule your test 2-4 weeks out understanding that some insurance companies will need an  authorization prior to the service being performed.   For non-scheduling related questions,  please contact the cardiac imaging nurse navigator should you have any questions/concerns: Marchia Bond, Cardiac Imaging Nurse Navigator Gordy Clement, Cardiac Imaging Nurse Navigator [facility] Direct Office Dial: [phone]   For scheduling needs, including cancellations and rescheduling, please call [place], [phone].  Other Instructions   Important Information About Sugar         Signed, Sanda Klein, MD  10/04/2022 7:48 PM    [place]

## 2022-09-30 NOTE — Patient Instructions (Signed)
Medication Instructions:  Your Physician recommend you continue on your current medication as directed.    *If you need a refill on your cardiac medications before your next appointment, please call your pharmacy*   Lab Work: Your physician recommends that you return for lab work (fasting lipid)  If you have labs (blood work) drawn today and your tests are completely normal, you will receive your results only by: [place] (if you have MyChart) OR A paper copy in the mail If you have any lab test that is abnormal or we need to change your treatment, we will call you to review the results.   Testing/Procedures: None ordered today   Follow-Up: At [facility], you and your health needs are our priority.  As part of our continuing mission to provide you with exceptional heart care, we have created designated Provider Care Teams.  These Care Teams include your primary Cardiologist (physician) and Advanced Practice Providers (APPs -  Physician Assistants and Nurse Practitioners) who all work together to provide you with the care you need, when you need it.  We recommend signing up for the patient portal called "MyChart".  Sign up information is provided on this After Visit Summary.  MyChart is used to connect with patients for Virtual Visits (Telemedicine).  Patients are able to view lab/test results, encounter notes, upcoming appointments, etc.  Non-urgent messages can be sent to your provider as well.   To learn more about what you can do with MyChart, go to [email or web address].    Your next appointment:   1 year(s)  The format for your next appointment:   In Person  Provider:   Sanda Klein, MD

## 2022-10-04 ENCOUNTER — Encounter: Payer: Self-pay | Admitting: Cardiovascular Disease

## 2022-10-13 DIAGNOSIS — H5211 Myopia, right eye: Secondary | ICD-10-CM | POA: Diagnosis not present

## 2022-10-28 ENCOUNTER — Other Ambulatory Visit: Payer: Self-pay | Admitting: Family Medicine

## 2022-10-28 DIAGNOSIS — M85832 Other specified disorders of bone density and structure, left forearm: Secondary | ICD-10-CM

## 2022-10-28 DIAGNOSIS — G4733 Obstructive sleep apnea (adult) (pediatric): Secondary | ICD-10-CM | POA: Diagnosis not present

## 2022-10-28 DIAGNOSIS — N951 Menopausal and female climacteric states: Secondary | ICD-10-CM | POA: Diagnosis not present

## 2022-10-28 DIAGNOSIS — F09 Unspecified mental disorder due to known physiological condition: Secondary | ICD-10-CM | POA: Diagnosis not present

## 2022-10-28 DIAGNOSIS — Z0000 Encounter for general adult medical examination without abnormal findings: Secondary | ICD-10-CM | POA: Diagnosis not present

## 2022-10-28 DIAGNOSIS — E7800 Pure hypercholesterolemia, unspecified: Secondary | ICD-10-CM | POA: Diagnosis not present

## 2022-10-28 DIAGNOSIS — K219 Gastro-esophageal reflux disease without esophagitis: Secondary | ICD-10-CM | POA: Diagnosis not present

## 2022-11-26 DIAGNOSIS — J329 Chronic sinusitis, unspecified: Secondary | ICD-10-CM | POA: Diagnosis not present

## 2023-02-09 DIAGNOSIS — E782 Mixed hyperlipidemia: Secondary | ICD-10-CM | POA: Diagnosis not present

## 2023-02-09 DIAGNOSIS — G4733 Obstructive sleep apnea (adult) (pediatric): Secondary | ICD-10-CM | POA: Diagnosis not present

## 2023-02-09 DIAGNOSIS — N951 Menopausal and female climacteric states: Secondary | ICD-10-CM | POA: Diagnosis not present

## 2023-02-09 DIAGNOSIS — R413 Other amnesia: Secondary | ICD-10-CM | POA: Diagnosis not present

## 2023-02-09 DIAGNOSIS — E010 Iodine-deficiency related diffuse (endemic) goiter: Secondary | ICD-10-CM | POA: Diagnosis not present

## 2023-02-10 ENCOUNTER — Other Ambulatory Visit: Payer: Self-pay | Admitting: Family Medicine

## 2023-02-10 DIAGNOSIS — E010 Iodine-deficiency related diffuse (endemic) goiter: Secondary | ICD-10-CM

## 2023-02-15 ENCOUNTER — Ambulatory Visit
Admission: RE | Admit: 2023-02-15 | Discharge: 2023-02-15 | Disposition: A | Discharge status: Home / Self Care | Payer: Medicare HMO | Source: Ambulatory Visit | Attending: Family Medicine | Admitting: Family Medicine

## 2023-02-15 DIAGNOSIS — E010 Iodine-deficiency related diffuse (endemic) goiter: Secondary | ICD-10-CM | POA: Diagnosis not present

## 2023-02-17 ENCOUNTER — Other Ambulatory Visit: Payer: Self-pay | Admitting: Family Medicine

## 2023-02-17 DIAGNOSIS — E041 Nontoxic single thyroid nodule: Secondary | ICD-10-CM

## 2023-03-09 ENCOUNTER — Other Ambulatory Visit (HOSPITAL_COMMUNITY): Admission: RE | Admit: 2023-03-09 | Payer: Medicare HMO | Source: Ambulatory Visit | Admitting: Interventional Radiology

## 2023-03-09 ENCOUNTER — Ambulatory Visit
Admission: RE | Admit: 2023-03-09 | Discharge: 2023-03-09 | Disposition: A | Discharge status: Home / Self Care | Payer: Medicare HMO | Source: Ambulatory Visit | Attending: Family Medicine | Admitting: Family Medicine

## 2023-03-09 ENCOUNTER — Other Ambulatory Visit (HOSPITAL_COMMUNITY)
Admission: RE | Admit: 2023-03-09 | Discharge: 2023-03-09 | Disposition: A | Discharge status: Home / Self Care | Payer: Medicare HMO | Source: Ambulatory Visit | Attending: Interventional Radiology | Admitting: Interventional Radiology

## 2023-03-09 DIAGNOSIS — E041 Nontoxic single thyroid nodule: Secondary | ICD-10-CM | POA: Insufficient documentation

## 2023-03-11 LAB — CYTOLOGY - NON PAP

## 2023-03-25 ENCOUNTER — Encounter (HOSPITAL_COMMUNITY): Payer: Self-pay

## 2023-03-29 DIAGNOSIS — J069 Acute upper respiratory infection, unspecified: Secondary | ICD-10-CM | POA: Diagnosis not present

## 2023-03-29 DIAGNOSIS — E010 Iodine-deficiency related diffuse (endemic) goiter: Secondary | ICD-10-CM | POA: Diagnosis not present

## 2023-03-29 DIAGNOSIS — Z03818 Encounter for observation for suspected exposure to other biological agents ruled out: Secondary | ICD-10-CM | POA: Diagnosis not present

## 2023-04-23 ENCOUNTER — Ambulatory Visit
Admission: RE | Admit: 2023-04-23 | Discharge: 2023-04-23 | Disposition: A | Discharge status: Home / Self Care | Payer: Medicare HMO | Source: Ambulatory Visit | Attending: Family Medicine | Admitting: Family Medicine

## 2023-04-23 DIAGNOSIS — M8589 Other specified disorders of bone density and structure, multiple sites: Secondary | ICD-10-CM | POA: Diagnosis not present

## 2023-04-23 DIAGNOSIS — M85832 Other specified disorders of bone density and structure, left forearm: Secondary | ICD-10-CM

## 2023-04-23 DIAGNOSIS — Z780 Asymptomatic menopausal state: Secondary | ICD-10-CM | POA: Diagnosis not present

## 2023-05-19 DIAGNOSIS — Z9989 Dependence on other enabling machines and devices: Secondary | ICD-10-CM | POA: Diagnosis not present

## 2023-05-19 DIAGNOSIS — E782 Mixed hyperlipidemia: Secondary | ICD-10-CM | POA: Diagnosis not present

## 2023-05-19 DIAGNOSIS — R413 Other amnesia: Secondary | ICD-10-CM | POA: Diagnosis not present

## 2023-05-19 DIAGNOSIS — G4733 Obstructive sleep apnea (adult) (pediatric): Secondary | ICD-10-CM | POA: Diagnosis not present

## 2023-05-19 DIAGNOSIS — K219 Gastro-esophageal reflux disease without esophagitis: Secondary | ICD-10-CM | POA: Diagnosis not present

## 2023-05-19 DIAGNOSIS — N951 Menopausal and female climacteric states: Secondary | ICD-10-CM | POA: Diagnosis not present

## 2023-06-08 DIAGNOSIS — G4733 Obstructive sleep apnea (adult) (pediatric): Secondary | ICD-10-CM | POA: Diagnosis not present

## 2023-06-08 DIAGNOSIS — E782 Mixed hyperlipidemia: Secondary | ICD-10-CM | POA: Diagnosis not present

## 2023-06-22 DIAGNOSIS — E7800 Pure hypercholesterolemia, unspecified: Secondary | ICD-10-CM | POA: Diagnosis not present

## 2023-06-22 DIAGNOSIS — G4700 Insomnia, unspecified: Secondary | ICD-10-CM | POA: Diagnosis not present

## 2023-06-22 DIAGNOSIS — M85839 Other specified disorders of bone density and structure, unspecified forearm: Secondary | ICD-10-CM | POA: Diagnosis not present

## 2023-08-25 DIAGNOSIS — G4733 Obstructive sleep apnea (adult) (pediatric): Secondary | ICD-10-CM | POA: Diagnosis not present

## 2023-08-25 DIAGNOSIS — E782 Mixed hyperlipidemia: Secondary | ICD-10-CM | POA: Diagnosis not present

## 2023-08-27 ENCOUNTER — Other Ambulatory Visit: Payer: Self-pay | Admitting: Cardiovascular Disease

## 2023-11-15 DIAGNOSIS — E782 Mixed hyperlipidemia: Secondary | ICD-10-CM | POA: Diagnosis not present

## 2023-11-15 DIAGNOSIS — G4733 Obstructive sleep apnea (adult) (pediatric): Secondary | ICD-10-CM | POA: Diagnosis not present

## 2023-11-15 DIAGNOSIS — N951 Menopausal and female climacteric states: Secondary | ICD-10-CM | POA: Diagnosis not present

## 2023-11-15 DIAGNOSIS — Z0000 Encounter for general adult medical examination without abnormal findings: Secondary | ICD-10-CM | POA: Diagnosis not present

## 2023-11-15 DIAGNOSIS — E041 Nontoxic single thyroid nodule: Secondary | ICD-10-CM | POA: Diagnosis not present

## 2023-11-15 DIAGNOSIS — R413 Other amnesia: Secondary | ICD-10-CM | POA: Diagnosis not present

## 2024-05-17 DIAGNOSIS — E041 Nontoxic single thyroid nodule: Secondary | ICD-10-CM | POA: Diagnosis not present

## 2024-05-17 DIAGNOSIS — N951 Menopausal and female climacteric states: Secondary | ICD-10-CM | POA: Diagnosis not present

## 2024-05-17 DIAGNOSIS — G4733 Obstructive sleep apnea (adult) (pediatric): Secondary | ICD-10-CM | POA: Diagnosis not present

## 2024-05-17 DIAGNOSIS — E782 Mixed hyperlipidemia: Secondary | ICD-10-CM | POA: Diagnosis not present

## 2024-05-17 DIAGNOSIS — R413 Other amnesia: Secondary | ICD-10-CM | POA: Diagnosis not present

## 2024-05-17 DIAGNOSIS — I2510 Atherosclerotic heart disease of native coronary artery without angina pectoris: Secondary | ICD-10-CM | POA: Diagnosis not present

## 2024-06-17 DIAGNOSIS — J069 Acute upper respiratory infection, unspecified: Secondary | ICD-10-CM | POA: Diagnosis not present

## 2024-06-23 DIAGNOSIS — J0100 Acute maxillary sinusitis, unspecified: Secondary | ICD-10-CM | POA: Diagnosis not present

## 2024-09-07 ENCOUNTER — Other Ambulatory Visit: Payer: Self-pay | Admitting: Cardiovascular Disease

## 2024-09-12 ENCOUNTER — Ambulatory Visit: Admitting: Neurology

## 2024-09-12 ENCOUNTER — Encounter: Payer: Self-pay | Admitting: Neurology

## 2024-09-12 VITALS — BP 152/78 | HR 65 | Ht 64.0 in | Wt 156.0 lb

## 2024-09-12 DIAGNOSIS — G3184 Mild cognitive impairment, so stated: Secondary | ICD-10-CM | POA: Diagnosis not present

## 2024-09-12 NOTE — Patient Instructions (Signed)
 Will obtain Head CT  Continue current medications  Continue to follow up with PCP  Return if worse    There are well-accepted and sensible ways to reduce risk for Alzheimers disease and other degenerative brain disorders .  Exercise Daily Walk A daily 20 minute walk should be part of your routine. Disease related apathy can be a significant roadblock to exercise and the only way to overcome this is to make it a daily routine and perhaps have a reward at the end (something your loved one loves to eat or drink perhaps) or a personal trainer coming to the home can also be very useful. Most importantly, the patient is much more likely to exercise if the caregiver / spouse does it with him/her. In general a structured, repetitive schedule is best.  General Health: Any diseases which effect your body will effect your brain such as a pneumonia, urinary infection, blood clot, heart attack or stroke. Keep contact with your primary care doctor for regular follow ups.  Sleep. A good nights sleep is healthy for the brain. Seven hours is recommended. If you have insomnia or poor sleep habits we can give you some instructions. If you have sleep apnea wear your mask.  Diet: Eating a heart healthy diet is also a good idea; fish and poultry instead of red meat, nuts (mostly non-peanuts), vegetables, fruits, olive oil or canola oil (instead of butter), minimal salt (use other spices to flavor foods), whole grain rice, bread, cereal and pasta and wine in moderation.Research is now showing that the MIND diet, which is a combination of The Mediterranean diet and the DASH diet, is beneficial for cognitive processing and longevity. Information about this diet can be found in The MIND Diet, a book by Annitta Feeling, MS, RDN, and online at WildWildScience.es  Finances, Power of [address] and Advance Directives: You should consider putting legal safeguards in place with regard to financial and medical  decision making. While the spouse always has power of attorney for medical and financial issues in the absence of any form, you should consider what you want in case the spouse / caregiver is no longer around or capable of making decisions.

## 2024-09-12 NOTE — Progress Notes (Signed)
 [facility]  PATIENT: [name] DOB: Jun 21, 1946  REQUESTING CLINICIAN: Arloa Elsie SAUNDERS, MD HISTORY FROM: Patient REASON FOR VISIT: Memory Loss    HISTORICAL  CHIEF COMPLAINT:  Chief Complaint  Patient presents with   New Patient (Initial Visit)    Pt in room 13. Alone.  paper referral for Cognitive dysfunction, worsening memory issues. MOCA:22    HISTORY OF PRESENT ILLNESS:  Discussed the use of AI scribe software for clinical note transcription with the patient, who gave verbal consent to proceed.  [name] is a 78 year old female with history of depression and hyperlipidemia who presents with memory concerns. She presents alone   She experiences difficulty remembering names, particularly those of people she encounters at church, but not family members or neighbors. Occasionally, she has trouble recalling conversations. Her husband has noted her memory issues, and she often needs to write things down on a calendar to remember dates and events.  She sometimes experiences word-finding difficulties but not consistently. She has been lost while driving in the past, although this has not occurred recently. She uses GPS for directions when shopping and has not been late on bills or had issues with cooking, though she has left the stove on after removing food.  She sometimes forgets to take her morning medication, paroxetine, but takes rosuvastatin  daily. She uses a paper to track medication intake but does not use phone reminders.  Her sleep is aided by melatonin, and she has a history of sleep apnea but has stopped using her CPAP machine.  There is a family history of dementia; her mother was diagnosed with dementia in her forties after a goiter removal.  No history of stroke, seizures, or traumatic brain injury. She has no difficulty using a cell phone or TV remote and has not experienced any recent falls.    TBI:   No past history of TBI Stroke:    no past history of stroke Seizures:   no past history of seizures Sleep:  yes but does not use CPAP Mood: On Paroxetine  Family history of Dementia: Mother with dementia  Functional status: independent in all ADLs and IADLs Patient lives with husband. Cooking: yes Cleaning: yes Shopping: no issues  Bathing: denies  Toileting: denies  Driving: has to use GPS  Bills: Denies  Medications: no issues  Ever left the stove on by accident?: Denies Forget how to use items around the house?: Denies  Getting lost going to familiar places?: Yes but not in the past couple years Forgetting loved ones names?: Denies  Word finding difficulty? Sometimes  Sleep: good with Melatonin   OTHER MEDICAL CONDITIONS: Depression, Hyperlipidemia    REVIEW OF SYSTEMS: Full 14 system review of systems performed and negative with exception of: As noted in the HPI  ALLERGIES: Allergies  Allergen Reactions   Codeine Itching    HOME MEDICATIONS: Outpatient Medications Prior to Visit  Medication Sig Dispense Refill   PARoxetine (PAXIL) 20 MG tablet Take 20 mg by mouth daily.     rosuvastatin  (CRESTOR ) 20 MG tablet TAKE 1 TABLET BY MOUTH EVERY DAY 30 tablet 0   VITAMIN E PO Take 1 tablet by mouth daily in the afternoon.     Ascorbic Acid (VITAMIN C PO) Take 1 tablet by mouth daily in the afternoon. (Patient not taking: Reported on 09/12/2024)     BIOTIN PO Take 1 capsule by mouth daily in the afternoon. (Patient not taking: Reported on 09/12/2024)  CHOLECALCIFEROL PO Take 1 tablet by mouth daily in the afternoon. (Patient not taking: Reported on 09/12/2024)     Cyanocobalamin  (VITAMIN B 12 PO) Take 1 tablet by mouth daily in the afternoon. (Patient not taking: Reported on 09/12/2024)     diclofenac (VOLTAREN) 75 MG EC tablet Take 75 mg by mouth as needed. (Patient not taking: Reported on 09/12/2024)     OVER THE COUNTER MEDICATION Take 1 capsule by mouth daily in the afternoon. (Patient not taking: Reported  on 09/12/2024)     No facility-administered medications prior to visit.    PAST MEDICAL HISTORY: Past Medical History:  Diagnosis Date   GERD (gastroesophageal reflux disease)    High cholesterol    OSA (obstructive sleep apnea)     PAST SURGICAL HISTORY: History reviewed. No pertinent surgical history.  FAMILY HISTORY: History reviewed. No pertinent family history.  SOCIAL HISTORY: Social History   Socioeconomic History   Marital status: Married    Spouse name: Not on file   Number of children: Not on file   Years of education: Not on file   Highest education level: Not on file  Occupational History   Not on file  Tobacco Use   Smoking status: Never   Smokeless tobacco: Never  Vaping Use   Vaping status: Never Used  Substance and Sexual Activity   Alcohol use: Never   Drug use: Never   Sexual activity: Not on file  Other Topics Concern   Not on file  Social History Narrative   Not on file   Social Drivers of Health   Financial Resource Strain: Not on file  Food Insecurity: Not on file  Transportation Needs: Not on file  Physical Activity: Not on file  Stress: Not on file  Social Connections: Not on file  Intimate Partner Violence: Not on file     PHYSICAL EXAM   GENERAL EXAM/CONSTITUTIONAL: Vitals:  Vitals:   09/12/24 1025  BP: (!) 152/78  Pulse: 65  Weight: 156 lb (70.8 kg)  Height: 5' 4 (1.626 m)   Body mass index is 26.78 kg/m. Wt Readings from Last 3 Encounters:  09/12/24 156 lb (70.8 kg)  07/23/22 170 lb 9.6 oz (77.4 kg)  10/08/16 160 lb (72.6 kg)   Patient is in no distress; well developed, nourished and groomed; neck is supple  MUSCULOSKELETAL: Gait, strength, tone, movements noted in Neurologic exam below  NEUROLOGIC: MENTAL STATUS:      No data to display            09/12/2024   10:27 AM  Montreal Cognitive Assessment   Visuospatial/ Executive (0/5) 3  Naming (0/3) 3  Attention: Read list of digits (0/2) 2   Attention: Read list of letters (0/1) 1  Attention: Serial 7 subtraction starting at 100 (0/3) 3  Language: Repeat phrase (0/2) 2  Language : Fluency (0/1) 0  Abstraction (0/2) 2  Delayed Recall (0/5) 0  Orientation (0/6) 6  Total 22    awake, alert, oriented to person, place and time recent and remote memory intact normal attention and concentration language fluent, comprehension intact, naming intact fund of knowledge appropriate  CRANIAL NERVE:  2nd, 3rd, 4th, 6th- visual fields full to confrontation, extraocular muscles intact, no nystagmus 5th - facial sensation symmetric 7th - facial strength symmetric 8th - hearing intact 9th - palate elevates symmetrically, uvula midline 11th - shoulder shrug symmetric 12th - tongue protrusion midline  MOTOR:  normal bulk and tone, full strength in the  BUE, BLE  SENSORY:  normal and symmetric to light touch  COORDINATION:  finger-nose-finger, fine finger movements normal  GAIT/STATION:  normal   DIAGNOSTIC DATA (LABS, IMAGING, TESTING) - I reviewed patient records, labs, notes, testing and imaging myself where available.  Lab Results  Component Value Date   WBC 6.9 07/05/2022   HGB 14.2 07/05/2022   HCT 44.6 07/05/2022   MCV 92.0 07/05/2022   PLT 244 07/05/2022      Component Value Date/Time   NA 139 07/05/2022 1423   K 4.0 07/05/2022 1423   CL 102 07/05/2022 1423   CO2 28 07/05/2022 1423   GLUCOSE 104 (H) 07/05/2022 1423   BUN 14 07/05/2022 1423   CREATININE 0.79 07/05/2022 1423   CALCIUM  9.1 07/05/2022 1423   PROT 7.0 01/06/2009 0858   ALBUMIN 3.1 (L) 01/06/2009 0858   AST 25 01/06/2009 0858   ALT 23 01/06/2009 0858   ALKPHOS 86 01/06/2009 0858   BILITOT 0.7 01/06/2009 0858   GFRNONAA >60 07/05/2022 1423   GFRAA  01/06/2009 0858    >60        The eGFR has been calculated using the MDRD equation. This calculation has not been validated in all clinical situations. eGFR's persistently <60 mL/min  signify possible Chronic Kidney Disease.   No results found for: CHOL, HDL, LDLCALC, LDLDIRECT, TRIG, CHOLHDL No results found for: YHAJ8R No results found for: VITAMINB12 No results found for: TSH  Recent labs 05/17/2024 TSH  1.52 B12  491 RPR  Non reactive     ASSESSMENT AND PLAN  78 y.o. year old female with history of depression and hyperlipidemia who present with complaints of memory loss. She is alone.   Mild cognitive impairment Mild cognitive impairment characterized by forgetfulness, particularly with names, but able to function independently. No evidence of dementia as activities of daily living are intact. Differential diagnosis includes Alzheimer's disease, but no definitive diagnosis of dementia. She prefers to wait on the blood test (ATN profile) to avoid potential anxiety. - Discussed the possibility of Alzheimer's disease and the availability of a blood test to assess for Alzheimer's-related memory loss. Explained that the test if positive can potentially explain her memory loss (Mild cognitive impairment due to Alzheimer disease) but if negative does not predict future dementia development. - Discussed that medications for dementia can slow progression but not stop or reverse it. - Order head CT to rule out other causes of memory problems such as stroke or brain tumor. - Follow up with results of head CT.    1. Mild cognitive impairment     Patient Instructions  Will obtain Head CT  Continue current medications  Continue to follow up with PCP  Return if worse    There are well-accepted and sensible ways to reduce risk for Alzheimers disease and other degenerative brain disorders .  Exercise Daily Walk A daily 20 minute walk should be part of your routine. Disease related apathy can be a significant roadblock to exercise and the only way to overcome this is to make it a daily routine and perhaps have a reward at the end (something your loved one  loves to eat or drink perhaps) or a personal trainer coming to the home can also be very useful. Most importantly, the patient is much more likely to exercise if the caregiver / spouse does it with him/her. In general a structured, repetitive schedule is best.  General Health: Any diseases which effect your body will effect your brain  such as a pneumonia, urinary infection, blood clot, heart attack or stroke. Keep contact with your primary care doctor for regular follow ups.  Sleep. A good nights sleep is healthy for the brain. Seven hours is recommended. If you have insomnia or poor sleep habits we can give you some instructions. If you have sleep apnea wear your mask.  Diet: Eating a heart healthy diet is also a good idea; fish and poultry instead of red meat, nuts (mostly non-peanuts), vegetables, fruits, olive oil or canola oil (instead of butter), minimal salt (use other spices to flavor foods), whole grain rice, bread, cereal and pasta and wine in moderation.Research is now showing that the MIND diet, which is a combination of The Mediterranean diet and the DASH diet, is beneficial for cognitive processing and longevity. Information about this diet can be found in The MIND Diet, a book by Annitta Feeling, MS, RDN, and online at WildWildScience.es  Finances, Power of [address] and Advance Directives: You should consider putting legal safeguards in place with regard to financial and medical decision making. While the spouse always has power of attorney for medical and financial issues in the absence of any form, you should consider what you want in case the spouse / caregiver is no longer around or capable of making decisions.   Orders Placed This Encounter  Procedures   CT HEAD WO CONTRAST ( )    No orders of the defined types were placed in this encounter.   Return if symptoms worsen or fail to improve.  I have spent a total of 60 minutes dedicated to this patient  today, preparing to see patient, performing a medically appropriate examination and evaluation, ordering tests and/or medications and procedures, and counseling and educating the patient/family/caregiver; independently interpreting result and communicating results to the family/patient/caregiver; and documenting clinical information in the electronic medical record.   Pastor Falling, MD 09/12/2024, 11:13 AM  [facility] [address], [address] [place], [place] [address] [phone]

## 2024-09-13 ENCOUNTER — Telehealth: Payer: Self-pay | Admitting: Neurology

## 2024-09-13 NOTE — Telephone Encounter (Signed)
 CT order sent to [facility] to schedule. [phone]

## 2024-10-04 DIAGNOSIS — R0789 Other chest pain: Secondary | ICD-10-CM | POA: Diagnosis not present

## 2024-10-04 DIAGNOSIS — K219 Gastro-esophageal reflux disease without esophagitis: Secondary | ICD-10-CM | POA: Diagnosis not present

## 2024-10-11 ENCOUNTER — Ambulatory Visit: Admitting: Cardiovascular Disease

## 2024-10-18 ENCOUNTER — Telehealth: Payer: Self-pay | Admitting: Cardiovascular Disease

## 2024-10-18 NOTE — Telephone Encounter (Signed)
 Patient would like to see her husband's Dr. Please advise

## 2024-10-18 NOTE — Telephone Encounter (Signed)
No objections

## 2024-10-19 ENCOUNTER — Other Ambulatory Visit: Payer: Self-pay | Admitting: Cardiovascular Disease

## 2024-10-20 ENCOUNTER — Encounter (HOSPITAL_BASED_OUTPATIENT_CLINIC_OR_DEPARTMENT_OTHER): Payer: Self-pay | Admitting: Cardiology

## 2024-10-20 ENCOUNTER — Ambulatory Visit (HOSPITAL_BASED_OUTPATIENT_CLINIC_OR_DEPARTMENT_OTHER): Admitting: Cardiology

## 2024-10-20 VITALS — BP 114/62 | HR 89 | Ht 64.0 in | Wt 154.8 lb

## 2024-10-20 DIAGNOSIS — E7800 Pure hypercholesterolemia, unspecified: Secondary | ICD-10-CM

## 2024-10-20 DIAGNOSIS — I2510 Atherosclerotic heart disease of native coronary artery without angina pectoris: Secondary | ICD-10-CM

## 2024-10-20 DIAGNOSIS — R079 Chest pain, unspecified: Secondary | ICD-10-CM

## 2024-10-20 DIAGNOSIS — R002 Palpitations: Secondary | ICD-10-CM

## 2024-10-20 DIAGNOSIS — R072 Precordial pain: Secondary | ICD-10-CM | POA: Diagnosis not present

## 2024-10-20 MED ORDER — METOPROLOL SUCCINATE ER 25 MG PO TB24: 25.0000 mg | ORAL_TABLET | Freq: Every day | ORAL | 11 refills | Status: AC

## 2024-10-20 MED ORDER — ASPIRIN 81 MG PO TBEC: 81.0000 mg | DELAYED_RELEASE_TABLET | Freq: Every day | ORAL | Status: AC

## 2024-10-20 NOTE — Patient Instructions (Addendum)
 Medication Instructions:  Your physician has recommended you make the following change in your medication:  Start aspirin  81 mg by mouth daily Start Metoprolol  Succinate 25 mg by mouth daily   *If you need a refill on your cardiac medications before your next appointment, please call your pharmacy*  Lab Work: Have lab work done in the lab on the 3rd floor today--Lipids, BMP, CRP, Lp(a) If you have labs (blood work) drawn today and your tests are completely normal, you will receive your results only by: MyChart Message (if you have MyChart) OR A paper copy in the mail If you have any lab test that is abnormal or we need to change your treatment, we will call you to review the results.  Testing/Procedures: Your physician has requested that you have cardiac CT. Cardiac computed tomography (CT) is a painless test that uses an x-ray machine to take clear, detailed pictures of your heart. For further information please visit [email or web address] Please follow instruction sheet as given.    Follow-Up: At [facility], you and your health needs are our priority.  As part of our continuing mission to provide you with exceptional heart care, our providers are all part of one team.  This team includes your primary Cardiologist (physician) and Advanced Practice Providers or APPs (Physician Assistants and Nurse Practitioners) who all work together to provide you with the care you need, when you need it.  Your next appointment:   11/21 at 11:40   Provider:   Shelda Bruckner, MD    We recommend signing up for the patient portal called MyChart.  Sign up information is provided on this After Visit Summary.  MyChart is used to connect with patients for Virtual Visits (Telemedicine).  Patients are able to view lab/test results, encounter notes, upcoming appointments, etc.  Non-urgent messages can be sent to your provider as well.   To learn more about what you can do with MyChart, go to  [email or web address].   Other Instructions   Your cardiac CT will be scheduled at one of the below locations:   [facility] [address] [place], [place] [address] [phone] (Severe contrast allergies only)  OR   [facility] [address] [place], [place] [address] [phone]  OR   [facility] [address] [place], [place] [address] [phone]  OR   [facility] and Vascular Tower [address]  [place], [place] [address]  OR   [facility] [address] [place], [place] [phone]  If scheduled at [facility], please arrive at the [facility] and Children's Entrance (Entrance C2) of [facility] 30 minutes prior to test start time. You can use the FREE valet parking offered at entrance C (encouraged to control the heart rate for the test)  Proceed to the [facility] Radiology Department (first floor) to check-in and test prep.  All radiology patients and guests should use entrance C2 at [facility], accessed from [facility], even though the hospital's physical address listed is [address]  If scheduled at the Heart and Vascular Tower at [address], please enter the parking lot using the [address] entrance and use the FREE valet service at the patient drop-off area. Enter the building and check-in with registration on the main floor.  If scheduled at [facility], please arrive to the Heart and Vascular Center 15 mins early for check-in and test prep.  There is spacious parking and easy access to the radiology department from the [facility] Heart and Vascular entrance. Please enter here and check-in with the desk attendant.   If scheduled at [facility], please arrive 30 minutes early for check-in and test prep.  Please follow these instructions carefully (unless otherwise directed):  An IV will be  required for this test and Nitroglycerin  will be given.  Hold all erectile dysfunction medications at least 3 days (72 hrs) prior to test. (Ie viagra, cialis, sildenafil, tadalafil, etc)   On the Night Before the Test: Be sure to Drink plenty of water. Do not consume any caffeinated/decaffeinated beverages or chocolate 12 hours prior to your test. Do not take any antihistamines 12 hours prior to your test.  On the Day of the Test: Drink plenty of water until 1 hour prior to the test. Do not eat any food 1 hour prior to test. You may take your regular medications prior to the test.  Take morning dose of metoprolol  succinate 2 hours prior to CT scan If you take Furosemide/Hydrochlorothiazide/Spironolactone/Chlorthalidone, please HOLD on the morning of the test. Patients who wear a continuous glucose monitor MUST remove the device prior to scanning. FEMALES- please wear underwire-free bra if available, avoid dresses & tight clothing  After the Test: Drink plenty of water. After receiving IV contrast, you may experience a mild flushed feeling. This is normal. On occasion, you may experience a mild rash up to 24 hours after the test. This is not dangerous. If this occurs, you can take Benadryl 25 mg, Zyrtec, Claritin, or Allegra and increase your fluid intake. (Patients taking Tikosyn should avoid Benadryl, and may take Zyrtec, Claritin, or Allegra) If you experience trouble breathing, this can be serious. If it is severe call 911 IMMEDIATELY. If it is mild, please call our office.  We will call to schedule your test 2-4 weeks out understanding that some insurance companies will need an authorization prior to the service being performed.   For more information and frequently asked questions, please visit our website : [email or web address]  For non-scheduling related questions, please contact the cardiac imaging nurse navigator should you have any questions/concerns: Cardiac  Imaging Nurse Navigators Direct Office Dial: [phone]   For scheduling needs, including cancellations and rescheduling, please call [place], [phone].

## 2024-10-20 NOTE — Progress Notes (Signed)
 Cardiology Office Note:  .   Date:  10/20/2024  ID:  [name], DOB 05/15/1946, MRN [id number] PCP: Arloa Elsie SAUNDERS, MD  [facility] Providers Cardiologist:  Shelda Bruckner, MD {  History of Present Illness: .   [name] is a 78 y.o. 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 She was previously seen by Dr. Francyne and established care with me on 10/20/24.  Pertinent CV history: Coronary CT 08/12/22 with Ca score 561 (86th %ile). Cadrads 3, 25-49% distal LM, 50-69% in pLAD that was not significant by FFR, mild disease in Lcx, no significant disease in RCA.  Today: She is having frequent heaviness in her chest and back. Happens multiple times/day, lasts hours. Getting worse. Not related to activity, food, position. Feels short of breath and nauseated with these episodes.   She is able to go to the [employer] and use the elliptical for about 15 minute. Can climb stairs. This does not worse pain.  Has rare palpitations, not frequent.  ROS positive for hot flashes as well. Sleeping well but generally feels fatigued chronically  ROS: Denies shortness of breath at rest or with normal exertion. No PND, orthopnea, LE edema or unexpected weight gain. No syncope. ROS otherwise negative except as noted.   Studies Reviewed: SABRA    EKG:  EKG Interpretation Date/Time:  Friday October 20 2024 08:15:33 EDT Ventricular Rate:  85 PR Interval:  136 QRS Duration:  76 QT Interval:  372 QTC Calculation: 442 R Axis:   16  Text Interpretation: Normal sinus rhythm Normal ECG When compared with ECG of 05-Jul-2022 14:18, No significant change was found Confirmed by Bruckner Shelda [phone]) on 10/20/2024 8:40:50 AM    Physical Exam:   VS:  BP 114/62   Pulse 89   Ht 5' 4 (1.626 m)   Wt 154 lb 12.8 oz (70.2 kg)   SpO2 96%   BMI 26.57 kg/m    Wt Readings from Last 3 Encounters:  10/20/24 154 lb 12.8 oz (70.2 kg)  09/12/24  156 lb (70.8 kg)  07/23/22 170 lb 9.6 oz (77.4 kg)    GEN: Well nourished, well developed in no acute distress HEENT: Normal, moist mucous membranes NECK: No JVD CARDIAC: regular rhythm, normal S1 and S2, no rubs or gallops. No murmur. VASCULAR: Radial and DP pulses 2+ bilaterally. No carotid bruits RESPIRATORY:  Clear to auscultation without rales, wheezing or rhonchi  ABDOMEN: Soft, non-tender, non-distended MUSCULOSKELETAL:  Ambulates independently SKIN: Warm and dry, no edema NEUROLOGIC:  Alert and oriented x 3. No focal neuro deficits noted. PSYCHIATRIC:  Normal affect    ASSESSMENT AND PLAN: .    Chest/back pain Nonobstructive CAD Hypercholesterolemia -LDL goal <70, labs 05/17/24 from [facility] show tchol 176, HDL 73, LDL 89, TG 74. Not at goal. Will use CT to guide--if plaque has progressed, would consider PCSK9i. Otherwise could increase rosuvastatin  or add ezetimibe at follow up. -has not taken aspirin  in the past. No major bruising or bleeding. She will start. -discussed stable vs. Unstable angina. Symptoms are not strictly exertional but also not severe. Blood pressure runs low, no room for imdur or amlodipine. Did discuss metoprolol  trial to see if this helps. She doesn't routinely check BP or HR but as a way to do this at home. Discussed signs to watch for, when to check vitals -discussed treadmill stress, nuclear stress/lexiscan, and CT coronary angiography. Discussed pros and cons of each, including but not limited to  false positive/false negative risk, radiation risk, and risk of IV contrast dye. Based on shared decision making, decision was made to pursue CT coronary angiography. -discussed checking lpa, crp, how these help us  -she is fasting, recheck lipids today -counseled on need to get BMET prior to test -counseled on use of sublingual nitroglycerin  and its importance to a good test -reviewed red flag warning signs that need immediate medical attention   OSA -continue  CPAP  Palpitations -trialing metoprolol  as above  CV risk counseling and prevention -recommend heart healthy/Mediterranean diet, with whole grains, fruits, vegetable, fish, lean meats, nuts, and olive oil. Limit salt. -recommend moderate walking, 3-5 times/week for 30-50 minutes each session. Aim for at least 150 minutes/week. Goal should be pace of 3 miles/hours, or walking 1.5 miles in 30 minutes -recommend avoidance of tobacco products. Avoid excess alcohol.  Dispo: 4 weeks, after CT  Total time of encounter: I spent 44 minutes dedicated to the care of this patient on the date of this encounter to include pre-visit review of records, face-to-face time with the patient discussing conditions above, and clinical documentation with the electronic health record. We specifically spent time today discussing chest pain, potential etiologies, workup pros/cons, plans for management of symptoms and test results   Signed, Shelda Bruckner, MD   Shelda Bruckner, MD, PhD, [facility]  [facility]    Heart & Vascular at [facility] at [facility] [address], [address] [place], [place] [address] [phone]

## 2024-10-23 LAB — BASIC METABOLIC PANEL WITH GFR
BUN/Creatinine Ratio: 23 (ref 12–28)
BUN: 19 mg/dL (ref 8–27)
CO2: 24 mmol/L (ref 20–29)
Calcium: 9 mg/dL (ref 8.7–10.3)
Chloride: 101 mmol/L (ref 96–106)
Creatinine, Ser: 0.83 mg/dL (ref 0.57–1.00)
Glucose: 109 mg/dL — ABNORMAL HIGH (ref 70–99)
Potassium: 4.4 mmol/L (ref 3.5–5.2)
Sodium: 140 mmol/L (ref 134–144)
eGFR: 72 mL/min/1.73 (ref >59)

## 2024-10-23 LAB — LIPID PANEL
Chol/HDL Ratio: 2.2 ratio (ref 0.0–4.4)
Cholesterol, Total: 158 mg/dL (ref 100–199)
HDL: 71 mg/dL (ref >39)
LDL Chol Calc (NIH): 75 mg/dL (ref 0–99)
Triglycerides: 58 mg/dL (ref 0–149)
VLDL Cholesterol Cal: 12 mg/dL (ref 5–40)

## 2024-10-23 LAB — LIPOPROTEIN A (LPA): Lipoprotein (a): 26.4 nmol/L (ref <75.0)

## 2024-10-23 LAB — HIGH SENSITIVITY CRP: CRP, High Sensitivity: 3.72 mg/L — ABNORMAL HIGH (ref 0.00–3.00)

## 2024-11-02 ENCOUNTER — Ambulatory Visit (HOSPITAL_COMMUNITY)
Admission: RE | Admit: 2024-11-02 | Discharge: 2024-11-02 | Disposition: A | Discharge status: Home / Self Care | Source: Ambulatory Visit | Attending: Cardiology | Admitting: Cardiology

## 2024-11-02 ENCOUNTER — Other Ambulatory Visit: Payer: Self-pay | Admitting: Cardiology

## 2024-11-02 DIAGNOSIS — R931 Abnormal findings on diagnostic imaging of heart and coronary circulation: Secondary | ICD-10-CM

## 2024-11-02 DIAGNOSIS — R072 Precordial pain: Secondary | ICD-10-CM | POA: Insufficient documentation

## 2024-11-02 DIAGNOSIS — I2510 Atherosclerotic heart disease of native coronary artery without angina pectoris: Secondary | ICD-10-CM | POA: Diagnosis not present

## 2024-11-02 MED ORDER — NITROGLYCERIN 0.4 MG SL SUBL
0.8000 mg | SUBLINGUAL_TABLET | Freq: Once | SUBLINGUAL | Status: AC
  Administered 2024-11-02: 0.8 mg via SUBLINGUAL

## 2024-11-02 MED ORDER — DILTIAZEM HCL 25 MG/5ML IV SOLN: 10.0000 mg | INTRAVENOUS | Status: DC | PRN

## 2024-11-02 MED ORDER — METOPROLOL TARTRATE 5 MG/5ML IV SOLN
10.0000 mg | Freq: Once | INTRAVENOUS | Status: AC | PRN
  Administered 2024-11-02: 10 mg via INTRAVENOUS

## 2024-11-02 MED ORDER — IOHEXOL 350 MG/ML SOLN
100.0000 mL | Freq: Once | INTRAVENOUS | Status: AC | PRN
  Administered 2024-11-02: 100 mL via INTRAVENOUS

## 2024-11-10 ENCOUNTER — Ambulatory Visit (HOSPITAL_BASED_OUTPATIENT_CLINIC_OR_DEPARTMENT_OTHER): Payer: Self-pay | Admitting: Cardiology

## 2024-11-17 ENCOUNTER — Encounter (HOSPITAL_BASED_OUTPATIENT_CLINIC_OR_DEPARTMENT_OTHER): Payer: Self-pay | Admitting: Cardiology

## 2024-11-17 ENCOUNTER — Ambulatory Visit (HOSPITAL_BASED_OUTPATIENT_CLINIC_OR_DEPARTMENT_OTHER): Admitting: Cardiology

## 2024-11-17 VITALS — BP 110/64 | HR 76 | Ht 64.0 in | Wt 155.9 lb

## 2024-11-17 DIAGNOSIS — R079 Chest pain, unspecified: Secondary | ICD-10-CM

## 2024-11-17 DIAGNOSIS — E7800 Pure hypercholesterolemia, unspecified: Secondary | ICD-10-CM

## 2024-11-17 DIAGNOSIS — I2510 Atherosclerotic heart disease of native coronary artery without angina pectoris: Secondary | ICD-10-CM

## 2024-11-17 DIAGNOSIS — R002 Palpitations: Secondary | ICD-10-CM | POA: Diagnosis not present

## 2024-11-17 DIAGNOSIS — Z712 Person consulting for explanation of examination or test findings: Secondary | ICD-10-CM

## 2024-11-17 MED ORDER — EZETIMIBE 10 MG PO TABS: 10.0000 mg | ORAL_TABLET | Freq: Every day | ORAL | 3 refills | Status: AC

## 2024-11-17 NOTE — Progress Notes (Signed)
 Cardiology Office Note:  .   Date:  11/17/2024  ID:  [name], DOB October 12, 1946, MRN [id number] PCP: Arloa Elsie SAUNDERS, MD  [facility] Providers Cardiologist:  Shelda Bruckner, MD {  History of Present Illness: .   [name] is a 78 y.o. 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 She was previously seen by Dr. Francyne and established care with me on 10/20/24.  Pertinent CV history: Coronary CT 08/12/22 with Ca score 561 (86th %ile). Cadrads 3, 25-49% distal LM, 50-69% in pLAD that was not significant by FFR, mild disease in Lcx, no significant disease in RCA.  Today: She has been having achy chest pain and shortness of breath on and off since yesterday. Not limiting. Central, nonradiating, not changed with palpation. She has had GERD in the past, not currently on medication for this, has not tried anything for this recently. She has been able to to the [facility] and use the elliptical, symptoms have not limited her activity.   Reviewed her CT, which was nearly identical to her CT in 2023. FFR was negative.   No fevers/chills, no cough. No recent changes.   Palpitations have been increased the last day or so.   ROS: No PND, orthopnea, LE edema or unexpected weight gain. No syncope. ROS otherwise negative except as noted.   Studies Reviewed: SABRA    EKG:       Physical Exam:   VS:  BP 110/64   Pulse 76   Ht 5' 4 (1.626 m)   Wt 155 lb 14.4 oz (70.7 kg)   SpO2 94%   BMI 26.76 kg/m    Wt Readings from Last 3 Encounters:  11/17/24 155 lb 14.4 oz (70.7 kg)  10/20/24 154 lb 12.8 oz (70.2 kg)  09/12/24 156 lb (70.8 kg)    GEN: Well nourished, well developed in no acute distress HEENT: Normal, moist mucous membranes NECK: No JVD CARDIAC: regular rhythm, normal S1 and S2, no rubs or gallops. No murmur. VASCULAR: Radial and DP pulses 2+ bilaterally. No carotid bruits RESPIRATORY:  Clear to auscultation without  rales, wheezing or rhonchi  ABDOMEN: Soft, non-tender, non-distended MUSCULOSKELETAL:  Ambulates independently SKIN: Warm and dry, no edema NEUROLOGIC:  Alert and oriented x 3. No focal neuro deficits noted. PSYCHIATRIC:  Normal affect    ASSESSMENT AND PLAN: .    Chest/back pain Nonobstructive CAD Hypercholesterolemia -we discussed aspirin  at last visit, she hasn't started. Reviewed again today, she is willing to try, will watch for bruising and bleeding. -we reviewed her CT results at length today. Largely unchanged from 2023 and without evidence of significant obstruction. Suggests against cardiac etiology, but if symptoms worsen, may need to discuss invasive evaluation for definitive answer -we discussed possible non-cardiac etiologies. Gave instructions on how to trial GI medications -lpa normal -CRP 3.72, above normal range, discussed -lipids showed LDL 75. Given that CT is stable, we discussed increasing rosuvastatin  or adding ezetimibe . We will add ezetimibe  and recheck in 3 mos. -reviewed red flag warning signs that need immediate medical attention   OSA -continue CPAP  Palpitations -trialing metoprolol  as above  CV risk counseling and prevention -recommend heart healthy/Mediterranean diet, with whole grains, fruits, vegetable, fish, lean meats, nuts, and olive oil. Limit salt. -recommend moderate walking, 3-5 times/week for 30-50 minutes each session. Aim for at least 150 minutes/week. Goal should be pace of 3 miles/hours, or walking 1.5 miles in 30 minutes -recommend avoidance of tobacco  products. Avoid excess alcohol.  Dispo: 3-4 months as long as symptoms not worsening  Signed, Shelda Bruckner, MD   Shelda Bruckner, MD, PhD, [facility] [place]  [facility]  [place]  [facility] at [facility] [address], [address] [place], [place] [address] [phone]

## 2024-11-17 NOTE — Patient Instructions (Addendum)
 I would try Tums/Rolaids/Mylanta to see if this helps with the chest discomfort. If it does, then I would talk to your primary care about starting an acid blocking medication long term to see if this will help with the symptoms. The heart test was stable from two years ago, no evidence that this has progressed, so unlikely to be the reason for the symptoms. However, as we discussed, if symptoms are severe and sustained, please get emergency medical attention to evaluate.  Start aspirin  81 mg daily. Watch for major bruising or bleeding (especially blood in the urine, blood in the stool, or black tarry/sticky stool).   We are adding ezetimibe  10 mg daily to try to get your cholesterol to goal. We will recheck these numbers in 3 months.   See you back in about 3-4 months - either with Dr. Lonni, Reche Finder, NP or Rosaline Bane, NP

## 2024-12-01 ENCOUNTER — Other Ambulatory Visit: Payer: Self-pay | Admitting: Family Medicine

## 2024-12-01 DIAGNOSIS — Z1231 Encounter for screening mammogram for malignant neoplasm of breast: Secondary | ICD-10-CM

## 2025-01-04 ENCOUNTER — Ambulatory Visit (HOSPITAL_BASED_OUTPATIENT_CLINIC_OR_DEPARTMENT_OTHER): Admitting: Cardiology

## 2025-01-05 ENCOUNTER — Ambulatory Visit
Admission: RE | Admit: 2025-01-05 | Discharge: 2025-01-05 | Disposition: A | Payer: Medicare (Managed Care) | Source: Ambulatory Visit | Attending: Family Medicine | Admitting: Family Medicine

## 2025-01-05 DIAGNOSIS — Z1231 Encounter for screening mammogram for malignant neoplasm of breast: Secondary | ICD-10-CM

## 2025-01-15 ENCOUNTER — Emergency Department (HOSPITAL_COMMUNITY): Payer: Medicare (Managed Care)

## 2025-01-15 ENCOUNTER — Other Ambulatory Visit: Payer: Self-pay

## 2025-01-15 ENCOUNTER — Encounter (HOSPITAL_COMMUNITY): Payer: Self-pay

## 2025-01-15 ENCOUNTER — Emergency Department (HOSPITAL_COMMUNITY)
Admission: EM | Admit: 2025-01-15 | Discharge: 2025-01-15 | Disposition: A | Discharge status: Home / Self Care | Payer: Medicare (Managed Care) | Attending: Emergency Medicine | Admitting: Emergency Medicine

## 2025-01-15 DIAGNOSIS — I2510 Atherosclerotic heart disease of native coronary artery without angina pectoris: Secondary | ICD-10-CM | POA: Diagnosis not present

## 2025-01-15 DIAGNOSIS — R0789 Other chest pain: Secondary | ICD-10-CM | POA: Insufficient documentation

## 2025-01-15 DIAGNOSIS — Z7982 Long term (current) use of aspirin: Secondary | ICD-10-CM | POA: Insufficient documentation

## 2025-01-15 DIAGNOSIS — R079 Chest pain, unspecified: Secondary | ICD-10-CM

## 2025-01-15 DIAGNOSIS — M7918 Myalgia, other site: Secondary | ICD-10-CM

## 2025-01-15 DIAGNOSIS — M546 Pain in thoracic spine: Secondary | ICD-10-CM | POA: Insufficient documentation

## 2025-01-15 LAB — CBC
HCT: 43.3 % (ref 36.0–46.0)
Hemoglobin: 13.8 g/dL (ref 12.0–15.0)
MCH: 29.6 pg (ref 26.0–34.0)
MCHC: 31.9 g/dL (ref 30.0–36.0)
MCV: 92.9 fL (ref 80.0–100.0)
Platelets: 236 K/uL (ref 150–400)
RBC: 4.66 MIL/uL (ref 3.87–5.11)
RDW: 12.3 % (ref 11.5–15.5)
WBC: 7.4 K/uL (ref 4.0–10.5)
nRBC: 0 % (ref 0.0–0.2)

## 2025-01-15 LAB — COMPREHENSIVE METABOLIC PANEL WITH GFR
ALT: 17 U/L (ref 0–44)
AST: 26 U/L (ref 15–41)
Albumin: 3.3 g/dL — ABNORMAL LOW (ref 3.5–5.0)
Alkaline Phosphatase: 80 U/L (ref 38–126)
Anion gap: 8 (ref 5–15)
BUN: 17 mg/dL (ref 8–23)
CO2: 27 mmol/L (ref 22–32)
Calcium: 9.3 mg/dL (ref 8.9–10.3)
Chloride: 104 mmol/L (ref 98–111)
Creatinine, Ser: 0.78 mg/dL (ref 0.44–1.00)
GFR, Estimated: >60 mL/min
Glucose, Bld: 86 mg/dL (ref 70–99)
Potassium: 4.3 mmol/L (ref 3.5–5.1)
Sodium: 139 mmol/L (ref 135–145)
Total Bilirubin: 0.4 mg/dL (ref 0.0–1.2)
Total Protein: 6.5 g/dL (ref 6.5–8.1)

## 2025-01-15 LAB — TROPONIN T, HIGH SENSITIVITY: Troponin T High Sensitivity: <15 ng/L (ref 0–19)

## 2025-01-15 LAB — LIPASE, BLOOD: Lipase: 31 U/L (ref 11–51)

## 2025-01-15 MED ORDER — ASPIRIN 81 MG PO CHEW
324.0000 mg | CHEWABLE_TABLET | Freq: Once | ORAL | Status: AC
  Administered 2025-01-15: 324 mg via ORAL
  Filled 2025-01-15: qty 4

## 2025-01-15 MED ORDER — NITROGLYCERIN 0.4 MG SL SUBL: 0.4000 mg | SUBLINGUAL_TABLET | SUBLINGUAL | Status: DC | PRN

## 2025-01-15 MED ORDER — IOHEXOL 350 MG/ML SOLN
75.0000 mL | Freq: Once | INTRAVENOUS | Status: AC | PRN
  Administered 2025-01-15: 75 mL via INTRAVENOUS

## 2025-01-15 NOTE — ED Provider Triage Note (Signed)
 Emergency Medicine Provider Triage Evaluation Note  Temika Sutphin , a 79 y.o. female  was evaluated in triage.  Pt complains of chest pain since 9 AM.  She describes it as sharp in nature.  She has some associated dyspnea.  She describes some back pain over the past several days in her upper back.  She has no prior cardiac history and has not been followed by cardiology.  Review of Systems  Positive: History of high cholesterol Negative: Cough fever chills nausea vomiting  Physical Exam  BP 116/69   Pulse 83   Temp 97.7 F (36.5 C)   Resp 17   SpO2 96%  Gen:   Awake, no distress   Resp:  Normal effort  MSK:   Moves extremities without difficulty  Other:  Mild epigastric tenderness to palpation and mild anterior chest tenderness to palpation  Medical Decision Making  Medically screening exam initiated at 11:21 AM.  Appropriate orders placed.  Trivia Heffelfinger was informed that the remainder of the evaluation will be completed by another provider, this initial triage assessment does not replace that evaluation, and the importance of remaining in the ED until their evaluation is complete.  Plan labs, CT, chest x-Lavonne Kinderman, patient advised of initiation of evaluation and need to remain for remainder of exam and full evaluation. EKG reviewed interpreted no evidence of ST elevation MI.   Levander Houston, MD 01/15/25 1122

## 2025-01-15 NOTE — Discharge Instructions (Addendum)
 [name]  Thank you for allowing us  to take care of you today.  You came to the Emergency Department today because you have had ongoing back pain for approximately a month, and had an episode of severe chest pain this morning, and came to the emergency department for further evaluation.  Here your blood counts, blood salts, pancreas, kidney, liver numbers were normal.  Your heart number was not elevated.  Your CTs do not show any broken bones or bones out of place in your back, we do not see any blood clots or other abnormalities on your lungs.  Thus it is unclear what exactly caused your episode of chest pain, however your workup is reassuring against emergency causes that we need to bring you into the hospital such as a heart attack, severe pneumonia, collapsed lung, broken bones etc.  Given you we do not see any bones that are broken or out of place on your CT of your back, you most likely have musculoskeletal pain, we recommend Tylenol , ibuprofen, over-the-counter lidocaine  patches, rest, ice, stretching, heat.  If you have persistent pain despite this you can follow-up with your primary care doctor to discuss next steps such as muscle relaxers versus physical therapy.  We will give you referral to follow-up with Dr. Lonni in cardiology, you should get a callback within the next 72 hours to help schedule this appointment, if you do not hear from them within 72 hours, you can call the cardiology schedulers at [phone].   To-Do: 1. Please follow-up with your primary doctor within 1 - 2 weeks / as soon as possible.   Please return to the Emergency Department or call 911 if you experience have worsening of your symptoms, or do not get better, new or different chest pain, shortness of breath, severe or significantly worsening pain, high fever, severe confusion, pass out or have any reason to think that you need emergency medical care.   We hope you feel better soon.   Department of  Emergency Medicine [facility]

## 2025-01-15 NOTE — ED Provider Notes (Addendum)
 " [place] [facility] AT [facility] Provider Note   CSN: [id number] Arrival date & time: 01/15/25  1030     Patient presents with: Chest Pain, Back Pain, and Shortness of Breath   [name] is a 79 y.o. female.   79 year old female history of nonobstructive CAD, hyperlipidemia, and GERD who presents emergency department chest pain.  9 AM this morning started having sharp chest pain.  Substernal.  Nonradiating.  Not pleuritic or exertional.  Nonpositional.  2 through 3 in severity.  Also had some shortness of breath with it as well.  No leg swelling.  No cough.  No fevers or chills.  No diaphoresis or vomiting.  Also has been having some upper back pain for several weeks as well       Prior to Admission medications  Medication Sig Start Date End Date Taking? Authorizing Provider  Ascorbic Acid (VITAMIN C PO) Take 1 tablet by mouth daily in the afternoon.    [provider]  aspirin  EC 81 MG tablet Take 1 tablet (81 mg total) by mouth daily. Swallow whole. 10/20/24   Lonni Slain, MD  BIOTIN PO Take 1 capsule by mouth daily in the afternoon. Patient not taking: Reported on 11/17/2024    [provider]  CHOLECALCIFEROL PO Take 1 tablet by mouth daily in the afternoon.    [provider]  Cyanocobalamin  (VITAMIN B 12 PO) Take 1 tablet by mouth daily in the afternoon.    [provider]  diclofenac (VOLTAREN) 75 MG EC tablet Take 75 mg by mouth as needed. Patient not taking: Reported on 11/17/2024    [provider]  ezetimibe  (ZETIA ) 10 MG tablet Take 1 tablet (10 mg total) by mouth daily. 11/17/24 02/15/25  Lonni Slain, MD  Melatonin 10 MG CAPS 1 capsule at bedtime as needed with food Orally Once a day    [provider]  metoprolol  succinate (TOPROL  XL) 25 MG 24 hr tablet Take 1 tablet (25 mg total) by mouth daily. Patient not taking: Reported on 11/17/2024 10/20/24   Lonni Slain, MD  OVER THE COUNTER MEDICATION Take 1 capsule by mouth daily in the afternoon. Patient not taking: Reported on 11/17/2024    [provider]  PARoxetine (PAXIL) 20 MG tablet Take 20 mg by mouth daily. Patient not taking: Reported on 11/17/2024 09/04/16   [provider]  rosuvastatin  (CRESTOR ) 20 MG tablet TAKE 1 TABLET BY MOUTH EVERY DAY 10/23/24   Lonni Slain, MD  VITAMIN E PO Take 1 tablet by mouth daily in the afternoon.    [provider]    Allergies: Codeine    Review of Systems  Updated Vital Signs BP 137/72 (BP Location: Right Arm)   Pulse 68   Temp 97.8 F (36.6 C) (Oral)   Resp 14   Ht 5' 4 (1.626 m)   Wt 68.9 kg   SpO2 100%   BMI 26.09 kg/m   Physical Exam Vitals and nursing note reviewed.  Constitutional:      General: She is not in acute distress.    Appearance: She is well-developed.  HENT:     Head: Normocephalic and atraumatic.     Right Ear: External ear normal.     Left Ear: External ear normal.     Nose: Nose normal.  Eyes:     Extraocular Movements: Extraocular movements intact.     Conjunctiva/sclera: Conjunctivae normal.     Pupils: Pupils are equal, round, and  reactive to light.  Cardiovascular:     Rate and Rhythm: Normal rate and regular rhythm.     Heart sounds: No murmur heard.    Comments: Chest pain not reproducible.  No rashes Pulmonary:     Effort: Pulmonary effort is normal. No respiratory distress.     Breath sounds: Normal breath sounds.  Musculoskeletal:     Cervical back: Normal range of motion and neck supple.     Right lower leg: No edema.     Left lower leg: No edema.     Comments: No c-spine or t-spine ttp. No stepoffs  Skin:    General: Skin is warm and dry.  Neurological:     Mental Status: She is alert and oriented to person, place, and time. Mental status is at baseline.  Psychiatric:        Mood and Affect: Mood normal.     (all labs ordered are listed, but only  abnormal results are displayed) Labs Reviewed  COMPREHENSIVE METABOLIC PANEL WITH GFR - Abnormal; Notable for the following components:      Result Value   Albumin 3.3 (*)    All other components within normal limits  CBC  LIPASE, BLOOD  TROPONIN T, HIGH SENSITIVITY    EKG: EKG Interpretation Date/Time:  Monday January 15 2025 11:18:59 EST Ventricular Rate:  73 PR Interval:  134 QRS Duration:  78 QT Interval:  388 QTC Calculation: 427 R Axis:   90  Text Interpretation: Normal sinus rhythm Rightward axis Borderline ECG When compared with ECG of 20-Oct-2024 08:15, PREVIOUS ECG IS PRESENT Confirmed by Yolande Charleston [phone]) on 01/15/2025 12:27:18 PM  Radiology: CT T-SPINE NO CHARGE Result Date: 01/15/2025 CLINICAL DATA:  Sharp chest pain with shortness of breath. EXAM: CT Thoracic Spine without contrast TECHNIQUE: Multiplanar CT images of the thoracic spine were reconstructed from contemporary CT of the Chest. RADIATION DOSE REDUCTION: This exam was performed according to the departmental dose-optimization program which includes automated exposure control, adjustment of the mA and/or kV according to patient size and/or use of iterative reconstruction technique. CONTRAST:  No additional COMPARISON:  Cardiac CT 11/02/2024 FINDINGS: Alignment: Mild convex right scoliosis. No focal angulation or significant listhesis. Vertebrae: No evidence of acute thoracic spine fracture or traumatic subluxation. No aggressive osseous lesions. Paraspinal and other soft tissues: No evidence of paraspinal swelling or fluid collection. Chest details dictated separately. Disc levels: Mild multilevel spondylosis with mild disc bulging and endplate osteophytes, greatest at L1-2. No large disc herniation demonstrated. IMPRESSION: 1. No evidence of acute thoracic spine fracture, traumatic subluxation or static signs of instability. 2. Mild multilevel spondylosis. Electronically Signed   By: Elsie Perone M.D.   On:  01/15/2025 15:17   CT Angio Chest PE W and/or Wo Contrast Result Date: 01/15/2025 CLINICAL DATA:  Pulmonary embolism (PE) suspected, low to intermediate prob, neg D-dimer Sharp chest pain today with shortness of breath. EXAM: CT ANGIOGRAPHY CHEST WITH CONTRAST TECHNIQUE: Multidetector CT imaging of the chest was performed using the standard protocol during bolus administration of intravenous contrast. Multiplanar CT image reconstructions and MIPs were obtained to evaluate the vascular anatomy. RADIATION DOSE REDUCTION: This exam was performed according to the departmental dose-optimization program which includes automated exposure control, adjustment of the mA and/or kV according to patient size and/or use of iterative reconstruction technique. CONTRAST:  75mL OMNIPAQUE  IOHEXOL  350 MG/ML SOLN COMPARISON:  Cardiac CT 11/02/2024.  Chest radiographs 01/15/2025. FINDINGS: Cardiovascular: The pulmonary arteries are well opacified with contrast to the  level of the segmental branches. There is no evidence of acute pulmonary embolism. No acute systemic arterial abnormalities are identified. There is atherosclerosis of the aorta, great vessels and coronary arteries. The heart size is normal. There is no pericardial effusion. Mediastinum/Nodes: There are no enlarged mediastinal, hilar or axillary lymph nodes.2.2 cm right thyroid  nodule on image 19/7. Previously evaluated by thyroid  ultrasound and biopsy in 2024. The esophagus appears unremarkable. Lungs/Pleura: No pleural effusion or pneumothorax. The lungs are clear. Upper abdomen:  Unremarkable.  There is no adrenal mass. Musculoskeletal/Chest wall: There is no chest wall mass or suspicious osseous finding. Spine details dictated separately. Review of the MIP images confirms the above findings. IMPRESSION: 1. No evidence of acute pulmonary embolism or other acute chest findings. 2. Right thyroid  nodule, previously evaluated by ultrasound and biopsy. 3.  Aortic  Atherosclerosis (ICD10-I70.0). Electronically Signed   By: Elsie Perone M.D.   On: 01/15/2025 15:13   DG Chest 1 View Result Date: 01/15/2025 CLINICAL DATA:  Chest pain. EXAM: CHEST  1 VIEW COMPARISON:  07/05/2022. FINDINGS: The heart size and mediastinal contours are within normal limits. Hyperinflation. Mild left basilar scarring/atelectasis. No focal consolidation, sizeable pleural effusion, or pneumothorax. No acute osseous abnormality. IMPRESSION: 1. No acute cardiopulmonary findings. 2. Mild left basilar scarring/atelectasis. 3. Hyperinflation. Electronically Signed   By: Harrietta Sherry M.D.   On: 01/15/2025 12:16     Procedures   Medications Ordered in the ED  nitroGLYCERIN  (NITROSTAT ) SL tablet 0.4 mg (has no administration in time range)  aspirin  chewable tablet 324 mg (324 mg Oral Given 01/15/25 1133)  iohexol  (OMNIPAQUE ) 350 MG/ML injection 75 mL (75 mLs Intravenous Contrast Given 01/15/25 1354)    Clinical Course as of 01/15/25 1619  Mon Jan 15, 2025  1511 Hx of CAD, follows with cards, has CP, getting CT PE, no ischemia on EKG, needs troponin, doesn't need delta, improved but ongoing, nonexertional, non-classic, has unrelated CP [LS]    Clinical Course User Index [LS] Rogelia Jerilynn RAMAN, MD                                 Medical Decision Making Amount and/or Complexity of Data Reviewed Radiology: ordered.  Risk Prescription drug management.   [name] is a 79 year old female history of nonobstructive CAD, hyperlipidemia, and GERD who presents emergency department chest pain.  Initial Ddx:  MI, PE, pneumonia, dissection, pericarditis, costochondritis, reflux  MDM:  With the patient's chest discomfort will obtain EKG and troponins to evaluate for MI.  Also considering pulmonary embolism and patient had a CTA ordered from triage.  Considered dissection but with their symmetric pulses, history, and description of the pain feel it is less likely.   Also  considered pericarditis but description is unlikely and they do not have risk factors for this diagnosis.  Chest pain not reproducible so feel it costochondritis less likely.  No infectious symptoms to suggest pneumonia at this time that would be causing pleuritic chest pain. With her back pain will also include t-spine ct.   Plan:  Labs Troponin EKG CTA  ED Summary/Re-evaluation:  Patient presents to the emergency department with chest pain.  Somewhat atypical.  Also having some back pain.  EKG without concerning findings.  CTA without PE.  Signed out to the oncoming physician awaiting troponin.  This patient presents to the ED for concern of complaints listed in HPI, this involves an extensive number of  treatment options, and is a complaint that carries with it a high risk of complications and morbidity. Disposition including potential need for admission considered.   Dispo: Pending remainder of workup  Additional history obtained from spouse Records reviewed Outpatient Clinic Notes The following labs were independently interpreted: Chemistry and show no acute abnormality I independently reviewed the following imaging with scope of interpretation limited to determining acute life threatening conditions related to emergency care: Chest x-ray and agree with the radiologist interpretation with the following exceptions: none I personally reviewed and interpreted cardiac monitoring: normal sinus rhythm  I personally reviewed and interpreted the pt's EKG: see above for interpretation  I have reviewed the patients home medications and made adjustments as needed Social Determinants of health:  Geriatric   Final diagnoses:  Chest pain, unspecified type  Acute midline thoracic back pain    ED Discharge Orders     None         Yolande Lamar BROCKS, MD 01/15/25 1619  "

## 2025-01-15 NOTE — ED Provider Notes (Signed)
" °  [place] EMERGENCY DEPARTMENT AT [facility] 3Provider Assume Care Note I assumed care of [name] on 01/15/2025 at 3 PM from Dr. Yolande.   Briefly, [name] is a 79 y.o. female who: PMHx: nonobstructive CAD, hyperlipidemia, GERD P/w chest pain beginning this morning, in the setting of chronic back pain, chest pain did not radiate to back, currently has mild chest pain Chest pain is nonexertional, patient needs single troponin given 6 hours since maximal intensity of symptoms    Plan at the time of handoff: Follow-up CT PE study, thoracic spine reformats in the setting of chronic back pain, follow-up troponin   Please refer to the original providers note for additional information regarding the care of [name].  Reassessment: I personally reassessed the patient: Patient without any acute complaints or additional questions.  Vital Signs:  ED Triage Vitals  Encounter Vitals Group     BP 01/15/25 1033 116/69     Girls Systolic BP Percentile --      Girls Diastolic BP Percentile --      Boys Systolic BP Percentile --      Boys Diastolic BP Percentile --      Pulse Rate 01/15/25 1033 83     Resp 01/15/25 1033 17     Temp 01/15/25 1033 97.7 F (36.5 C)     Temp Source 01/15/25 1500 Oral     SpO2 01/15/25 1033 96 %     Weight 01/15/25 1129 152 lb (68.9 kg)     Height 01/15/25 1129 5' 4 (1.626 m)     Head Circumference --      Peak Flow --      Pain Score 01/15/25 1129 6     Pain Loc --      Pain Education --      Exclude from Growth Chart --      Hemodynamics:  The patient is hemodynamically stable. Mental Status:  The patient is alert  Additional MDM: On reevaluation, patient denies any current chest pain.  CBC without leukocytosis, anemia, thrombocytopenia, CMP without AKI, emergent electrolyte check, emergent LFT abnormality, lipase WNL.  Troponin undetectable, do not feel that patient requires delta troponin given greater than 6 hours  since the maximal intensity of symptoms.  CT PE study does not demonstrate PE, CT T-spine does not demonstrate any traumatic pathology.  Discussed with patient and family at bedside that back pain is most likely musculoskeletal, discussed Tylenol , ibuprofen, rest, ice, stretching, and follow-up with PCP.  Discussed that chest pain workup is reassuring against underlying emergent etiology of chest pain, however she does need to follow-up with cardiology for further outpatient workup given she does have cardiac risk factors, patient and family at bedside (husband, son, daughter-in-law, grandson) expressed understanding, comfortable with this plan, given patient with reassuring workup, currently pain-free, will discharge patient in stable condition.  Disposition: DISCHARGE: I believe that the patient is safe for discharge home with outpatient follow-up. Patient was informed of all pertinent physical exam, laboratory, and imaging findings. Patient's suspected etiology of their symptom presentation was discussed with the patient and all questions were answered. We discussed following up with PCP, cardiology. I provided thorough ED return precautions. The patient feels safe and comfortable with this plan.   [name] Later, MD Emergency Medicine    Later Jessica RAMAN, MD 01/15/25 1818  "

## 2025-01-15 NOTE — ED Notes (Signed)
 Pt ambulated to the bathroom and back without any incidents. Family at bedside

## 2025-01-15 NOTE — ED Triage Notes (Signed)
 Pt came in via POV d/t SOB & central CP that began this morning, does have some pain radiation to her back, but has been having back pain for the past month. A/Ox4, rates her pain 6/10 during triage.

## 2025-03-21 ENCOUNTER — Ambulatory Visit (HOSPITAL_BASED_OUTPATIENT_CLINIC_OR_DEPARTMENT_OTHER): Admitting: Cardiology
